# Patient Record
Sex: Male | Born: 1960 | Race: White | Hispanic: No | Smoking: Never smoker
Health system: Southern US, Community
[De-identification: ages and names within clinical notes are randomized; demographics above are authoritative.]

## PROBLEM LIST (undated history)

## (undated) ENCOUNTER — Ambulatory Visit (HOSPITAL_COMMUNITY): Admission: EM | Payer: BC Managed Care – PPO | Source: Home / Self Care

## (undated) DIAGNOSIS — F32A Depression, unspecified: Secondary | ICD-10-CM

## (undated) DIAGNOSIS — E119 Type 2 diabetes mellitus without complications: Secondary | ICD-10-CM

## (undated) DIAGNOSIS — M1990 Unspecified osteoarthritis, unspecified site: Secondary | ICD-10-CM

## (undated) DIAGNOSIS — T7840XA Allergy, unspecified, initial encounter: Secondary | ICD-10-CM

## (undated) DIAGNOSIS — I82409 Acute embolism and thrombosis of unspecified deep veins of unspecified lower extremity: Secondary | ICD-10-CM

## (undated) DIAGNOSIS — D689 Coagulation defect, unspecified: Secondary | ICD-10-CM

## (undated) DIAGNOSIS — F329 Major depressive disorder, single episode, unspecified: Secondary | ICD-10-CM

## (undated) DIAGNOSIS — D649 Anemia, unspecified: Secondary | ICD-10-CM

## (undated) HISTORY — DX: Coagulation defect, unspecified: D68.9

## (undated) HISTORY — DX: Allergy, unspecified, initial encounter: T78.40XA

## (undated) HISTORY — PX: BARIATRIC SURGERY: SHX1103

## (undated) HISTORY — DX: Anemia, unspecified: D64.9

## (undated) HISTORY — DX: Depression, unspecified: F32.A

## (undated) HISTORY — DX: Type 2 diabetes mellitus without complications: E11.9

## (undated) HISTORY — DX: Major depressive disorder, single episode, unspecified: F32.9

## (undated) HISTORY — DX: Unspecified osteoarthritis, unspecified site: M19.90

---

## 2000-06-14 ENCOUNTER — Encounter: Payer: Self-pay | Admitting: Sports Medicine

## 2000-06-14 ENCOUNTER — Ambulatory Visit (HOSPITAL_COMMUNITY): Admission: RE | Admit: 2000-06-14 | Discharge: 2000-06-14 | Payer: Self-pay | Admitting: Internal Medicine

## 2000-06-14 ENCOUNTER — Inpatient Hospital Stay (HOSPITAL_COMMUNITY): Admission: AD | Admit: 2000-06-14 | Discharge: 2000-06-16 | Payer: Self-pay | Admitting: Sports Medicine

## 2000-06-21 ENCOUNTER — Encounter: Admission: RE | Admit: 2000-06-21 | Discharge: 2000-06-21 | Payer: Self-pay | Admitting: Family Medicine

## 2000-06-24 ENCOUNTER — Encounter: Admission: RE | Admit: 2000-06-24 | Discharge: 2000-06-24 | Payer: Self-pay | Admitting: Family Medicine

## 2000-06-28 ENCOUNTER — Encounter: Admission: RE | Admit: 2000-06-28 | Discharge: 2000-06-28 | Payer: Self-pay | Admitting: Family Medicine

## 2000-07-02 ENCOUNTER — Encounter: Admission: RE | Admit: 2000-07-02 | Discharge: 2000-07-02 | Payer: Self-pay | Admitting: Family Medicine

## 2000-07-08 ENCOUNTER — Encounter: Admission: RE | Admit: 2000-07-08 | Discharge: 2000-07-08 | Payer: Self-pay | Admitting: Family Medicine

## 2000-08-27 ENCOUNTER — Encounter: Admission: RE | Admit: 2000-08-27 | Discharge: 2000-08-27 | Payer: Self-pay | Admitting: Family Medicine

## 2002-02-10 ENCOUNTER — Encounter: Admission: RE | Admit: 2002-02-10 | Discharge: 2002-02-10 | Payer: Self-pay | Admitting: Family Medicine

## 2003-10-22 ENCOUNTER — Emergency Department (HOSPITAL_COMMUNITY): Admission: EM | Admit: 2003-10-22 | Discharge: 2003-10-23 | Payer: Self-pay | Admitting: Emergency Medicine

## 2003-10-22 ENCOUNTER — Encounter: Admission: RE | Admit: 2003-10-22 | Discharge: 2003-10-22 | Payer: Self-pay | Admitting: Sports Medicine

## 2003-10-22 ENCOUNTER — Ambulatory Visit (HOSPITAL_COMMUNITY): Admission: RE | Admit: 2003-10-22 | Discharge: 2003-10-22 | Payer: Self-pay | Admitting: Emergency Medicine

## 2003-10-29 ENCOUNTER — Encounter: Admission: RE | Admit: 2003-10-29 | Discharge: 2003-10-29 | Payer: Self-pay | Admitting: Family Medicine

## 2004-06-17 ENCOUNTER — Encounter: Admission: RE | Admit: 2004-06-17 | Discharge: 2004-06-17 | Payer: Self-pay | Admitting: Sports Medicine

## 2008-12-14 HISTORY — PX: VENA CAVA FILTER PLACEMENT: SUR1032

## 2009-07-25 ENCOUNTER — Ambulatory Visit: Admission: RE | Admit: 2009-07-25 | Discharge: 2009-07-25 | Payer: Self-pay | Admitting: Internal Medicine

## 2009-07-25 ENCOUNTER — Encounter: Payer: Self-pay | Admitting: Internal Medicine

## 2009-07-29 ENCOUNTER — Inpatient Hospital Stay (HOSPITAL_COMMUNITY): Admission: EM | Admit: 2009-07-29 | Discharge: 2009-08-07 | Payer: Self-pay | Admitting: Emergency Medicine

## 2009-07-30 ENCOUNTER — Encounter (INDEPENDENT_AMBULATORY_CARE_PROVIDER_SITE_OTHER): Payer: Self-pay | Admitting: Internal Medicine

## 2009-08-01 ENCOUNTER — Ambulatory Visit: Payer: Self-pay | Admitting: Vascular Surgery

## 2009-08-01 ENCOUNTER — Encounter (INDEPENDENT_AMBULATORY_CARE_PROVIDER_SITE_OTHER): Payer: Self-pay | Admitting: Internal Medicine

## 2009-08-02 ENCOUNTER — Ambulatory Visit: Payer: Self-pay | Admitting: Vascular Surgery

## 2009-08-12 ENCOUNTER — Encounter: Admission: RE | Admit: 2009-08-12 | Discharge: 2009-08-12 | Payer: Self-pay | Admitting: Family Medicine

## 2011-03-21 LAB — GLUCOSE, CAPILLARY
Glucose-Capillary: 116 mg/dL — ABNORMAL HIGH (ref 70–99)
Glucose-Capillary: 122 mg/dL — ABNORMAL HIGH (ref 70–99)
Glucose-Capillary: 132 mg/dL — ABNORMAL HIGH (ref 70–99)
Glucose-Capillary: 138 mg/dL — ABNORMAL HIGH (ref 70–99)
Glucose-Capillary: 154 mg/dL — ABNORMAL HIGH (ref 70–99)
Glucose-Capillary: 158 mg/dL — ABNORMAL HIGH (ref 70–99)
Glucose-Capillary: 170 mg/dL — ABNORMAL HIGH (ref 70–99)
Glucose-Capillary: 183 mg/dL — ABNORMAL HIGH (ref 70–99)
Glucose-Capillary: 201 mg/dL — ABNORMAL HIGH (ref 70–99)
Glucose-Capillary: 21 mg/dL — CL (ref 70–99)
Glucose-Capillary: 210 mg/dL — ABNORMAL HIGH (ref 70–99)
Glucose-Capillary: 217 mg/dL — ABNORMAL HIGH (ref 70–99)
Glucose-Capillary: 258 mg/dL — ABNORMAL HIGH (ref 70–99)
Glucose-Capillary: 272 mg/dL — ABNORMAL HIGH (ref 70–99)
Glucose-Capillary: 273 mg/dL — ABNORMAL HIGH (ref 70–99)
Glucose-Capillary: 99 mg/dL (ref 70–99)

## 2011-03-21 LAB — BASIC METABOLIC PANEL
BUN: 11 mg/dL (ref 6–23)
BUN: 12 mg/dL (ref 6–23)
BUN: 14 mg/dL (ref 6–23)
BUN: 9 mg/dL (ref 6–23)
CO2: 26 mEq/L (ref 19–32)
CO2: 26 mEq/L (ref 19–32)
CO2: 27 mEq/L (ref 19–32)
CO2: 27 mEq/L (ref 19–32)
CO2: 28 mEq/L (ref 19–32)
Calcium: 10.1 mg/dL (ref 8.4–10.5)
Calcium: 9 mg/dL (ref 8.4–10.5)
Calcium: 9.1 mg/dL (ref 8.4–10.5)
Calcium: 9.5 mg/dL (ref 8.4–10.5)
Calcium: 9.8 mg/dL (ref 8.4–10.5)
Chloride: 100 mEq/L (ref 96–112)
Chloride: 101 mEq/L (ref 96–112)
Chloride: 102 mEq/L (ref 96–112)
Chloride: 98 mEq/L (ref 96–112)
Creatinine, Ser: 0.76 mg/dL (ref 0.4–1.5)
Creatinine, Ser: 0.8 mg/dL (ref 0.4–1.5)
Creatinine, Ser: 0.86 mg/dL (ref 0.4–1.5)
Creatinine, Ser: 0.87 mg/dL (ref 0.4–1.5)
Creatinine, Ser: 0.91 mg/dL (ref 0.4–1.5)
GFR calc Af Amer: >60 mL/min (ref >60)
GFR calc Af Amer: >60 mL/min (ref >60)
GFR calc Af Amer: >60 mL/min (ref >60)
GFR calc Af Amer: >60 mL/min (ref >60)
GFR calc non Af Amer: >60 mL/min (ref >60)
GFR calc non Af Amer: >60 mL/min (ref >60)
GFR calc non Af Amer: >60 mL/min (ref >60)
Glucose, Bld: 212 mg/dL — ABNORMAL HIGH (ref 70–99)
Potassium: 4.2 mEq/L (ref 3.5–5.1)
Potassium: 4.3 mEq/L (ref 3.5–5.1)
Potassium: 4.8 mEq/L (ref 3.5–5.1)
Sodium: 131 mEq/L — ABNORMAL LOW (ref 135–145)
Sodium: 135 mEq/L (ref 135–145)
Sodium: 137 mEq/L (ref 135–145)

## 2011-03-21 LAB — CBC
HCT: 44 % (ref 39.0–52.0)
HCT: 44.4 % (ref 39.0–52.0)
HCT: 49.8 % (ref 39.0–52.0)
Hemoglobin: 14.8 g/dL (ref 13.0–17.0)
Hemoglobin: 15.3 g/dL (ref 13.0–17.0)
Hemoglobin: 16 g/dL (ref 13.0–17.0)
Hemoglobin: 17.1 g/dL — ABNORMAL HIGH (ref 13.0–17.0)
MCHC: 34.1 g/dL (ref 30.0–36.0)
MCHC: 34.3 g/dL (ref 30.0–36.0)
MCHC: 34.4 g/dL (ref 30.0–36.0)
MCHC: 34.4 g/dL (ref 30.0–36.0)
MCHC: 34.8 g/dL (ref 30.0–36.0)
MCV: 88.9 fL (ref 78.0–100.0)
MCV: 89.1 fL (ref 78.0–100.0)
MCV: 89.1 fL (ref 78.0–100.0)
MCV: 89.2 fL (ref 78.0–100.0)
MCV: 89.3 fL (ref 78.0–100.0)
MCV: 89.4 fL (ref 78.0–100.0)
MCV: 89.4 fL (ref 78.0–100.0)
Platelets: 241 10*3/uL (ref 150–400)
Platelets: 251 10*3/uL (ref 150–400)
Platelets: 252 10*3/uL (ref 150–400)
RBC: 4.79 MIL/uL (ref 4.22–5.81)
RBC: 4.83 MIL/uL (ref 4.22–5.81)
RBC: 4.89 MIL/uL (ref 4.22–5.81)
RBC: 5.22 MIL/uL (ref 4.22–5.81)
RBC: 5.6 MIL/uL (ref 4.22–5.81)
RDW: 12.2 % (ref 11.5–15.5)
RDW: 12.2 % (ref 11.5–15.5)
RDW: 12.3 % (ref 11.5–15.5)
RDW: 12.4 % (ref 11.5–15.5)
WBC: 10.2 10*3/uL (ref 4.0–10.5)
WBC: 7.8 10*3/uL (ref 4.0–10.5)
WBC: 8.8 10*3/uL (ref 4.0–10.5)
WBC: 9.6 10*3/uL (ref 4.0–10.5)
WBC: 9.7 10*3/uL (ref 4.0–10.5)
WBC: 9.9 10*3/uL (ref 4.0–10.5)

## 2011-03-21 LAB — HEPARIN LEVEL (UNFRACTIONATED)
Heparin Unfractionated: 0.3 IU/mL (ref 0.30–0.70)
Heparin Unfractionated: 0.33 IU/mL (ref 0.30–0.70)
Heparin Unfractionated: 0.43 IU/mL (ref 0.30–0.70)

## 2011-03-21 LAB — LUPUS ANTICOAGULANT PANEL: PTT Lupus Anticoagulant: 37.5 secs (ref 36.3–48.8)

## 2011-03-21 LAB — DIFFERENTIAL
Basophils Absolute: 0.1 10*3/uL (ref 0.0–0.1)
Basophils Relative: 0 % (ref 0–1)
Basophils Relative: 1 % (ref 0–1)
Eosinophils Absolute: 0.4 10*3/uL (ref 0.0–0.7)
Eosinophils Absolute: 0.6 10*3/uL (ref 0.0–0.7)
Eosinophils Relative: 5 % (ref 0–5)
Lymphs Abs: 1.9 10*3/uL (ref 0.7–4.0)
Monocytes Absolute: 0.6 10*3/uL (ref 0.1–1.0)
Monocytes Absolute: 0.8 10*3/uL (ref 0.1–1.0)
Monocytes Relative: 8 % (ref 3–12)
Neutro Abs: 6.4 10*3/uL (ref 1.7–7.7)
Neutrophils Relative %: 57 % (ref 43–77)

## 2011-03-21 LAB — LIPID PANEL
HDL: 32 mg/dL — ABNORMAL LOW (ref >39)
HDL: 33 mg/dL — ABNORMAL LOW (ref >39)
Triglycerides: 214 mg/dL — ABNORMAL HIGH (ref <150)
VLDL: 43 mg/dL — ABNORMAL HIGH (ref 0–40)

## 2011-03-21 LAB — FACTOR 5 LEIDEN

## 2011-03-21 LAB — CULTURE, BLOOD (ROUTINE X 2)
Culture: NO GROWTH
Culture: NO GROWTH

## 2011-03-21 LAB — PROTIME-INR
INR: 1 (ref 0.00–1.49)
INR: 1.1 (ref 0.00–1.49)
INR: 1.1 (ref 0.00–1.49)
INR: 1.2 (ref 0.00–1.49)
INR: 1.9 — ABNORMAL HIGH (ref 0.00–1.49)
INR: 2.7 — ABNORMAL HIGH (ref 0.00–1.49)
Prothrombin Time: 15.5 seconds — ABNORMAL HIGH (ref 11.6–15.2)
Prothrombin Time: 17.8 seconds — ABNORMAL HIGH (ref 11.6–15.2)
Prothrombin Time: 20.7 seconds — ABNORMAL HIGH (ref 11.6–15.2)

## 2011-03-21 LAB — PHOSPHORUS
Phosphorus: 3.2 mg/dL (ref 2.3–4.6)
Phosphorus: 3.4 mg/dL (ref 2.3–4.6)
Phosphorus: 4.4 mg/dL (ref 2.3–4.6)

## 2011-03-21 LAB — MAGNESIUM
Magnesium: 2 mg/dL (ref 1.5–2.5)
Magnesium: 2.2 mg/dL (ref 1.5–2.5)
Magnesium: 2.4 mg/dL (ref 1.5–2.5)
Magnesium: 2.4 mg/dL (ref 1.5–2.5)

## 2011-03-21 LAB — TSH: TSH: 3.426 u[IU]/mL (ref 0.350–4.500)

## 2011-03-21 LAB — HEMOGLOBIN A1C: Mean Plasma Glucose: 260 mg/dL

## 2011-03-21 LAB — BETA-2-GLYCOPROTEIN I ABS, IGG/M/A: Beta-2-Glycoprotein I IgM: <10 U/mL (ref <20)

## 2011-03-21 LAB — PROTEIN S ACTIVITY: Protein S Activity: 89 % (ref 69–129)

## 2011-03-21 LAB — PROTEIN S, TOTAL: Protein S Ag, Total: 172 % — ABNORMAL HIGH (ref 70–140)

## 2011-03-21 LAB — CARDIOLIPIN ANTIBODIES, IGG, IGM, IGA: Anticardiolipin IgA: <10 [APL'U] — ABNORMAL LOW (ref <12)

## 2011-03-21 LAB — PROTEIN C ACTIVITY: Protein C Activity: 195 % — ABNORMAL HIGH (ref 75–133)

## 2011-03-21 LAB — PROTHROMBIN GENE MUTATION

## 2011-04-02 ENCOUNTER — Emergency Department (HOSPITAL_COMMUNITY)
Admission: EM | Admit: 2011-04-02 | Discharge: 2011-04-02 | Disposition: A | Discharge status: Home / Self Care | Payer: BC Managed Care – PPO | Attending: Emergency Medicine | Admitting: Emergency Medicine

## 2011-04-02 DIAGNOSIS — M7120 Synovial cyst of popliteal space [Baker], unspecified knee: Secondary | ICD-10-CM | POA: Insufficient documentation

## 2011-04-02 DIAGNOSIS — I10 Essential (primary) hypertension: Secondary | ICD-10-CM | POA: Insufficient documentation

## 2011-04-02 DIAGNOSIS — M79609 Pain in unspecified limb: Secondary | ICD-10-CM | POA: Insufficient documentation

## 2011-04-02 DIAGNOSIS — E669 Obesity, unspecified: Secondary | ICD-10-CM | POA: Insufficient documentation

## 2011-04-02 DIAGNOSIS — E119 Type 2 diabetes mellitus without complications: Secondary | ICD-10-CM | POA: Insufficient documentation

## 2011-04-02 DIAGNOSIS — M7989 Other specified soft tissue disorders: Secondary | ICD-10-CM | POA: Insufficient documentation

## 2011-04-28 NOTE — H&P (Signed)
NAME:  David Weeks, David Weeks                 ACCOUNT NO.:  [id number]   MEDICAL RECORD NO.:  [id number]          PATIENT TYPE:  INP   LOCATION:  4731                         FACILITY:  [facility]   PHYSICIAN:  Thad Ranger, MD       DATE OF BIRTH:  1961-10-21   DATE OF ADMISSION:  07/29/2009  DATE OF DISCHARGE:                              HISTORY & PHYSICAL   PRIMARY CARE PHYSICIAN:  Marjory Lies, MD   CHIEF COMPLAINT:  Acute shortness of breath with a history of DVT.   HISTORY OF PRESENT ILLNESS:  David Weeks is a 50 year old male with  history of left lower extremity 9 years ago, hypertension,  hyperlipidemia, and diabetes mellitus presented [facility]  today with acute dyspnea on exertion, dizziness, lightheadedness with  feeling clammy, and diaphoretic.  The patient was just recently  diagnosed with acute DVT in right lower extremity on July 25, 2009,  when he felt pain and swelling in his right leg.  CT angiogram was done  today, which showed large acute pulmonary embolism in the distal main  pulmonary artery extending into the right lower lobe and right middle  lobe of the lung.  Incidentally, the patient has been on therapeutic  Lovenox and Coumadin 5 mg daily.  However, INR is still subtherapeutic  at 1.0.  Lovenox and Coumadin was started on July 25, 2009, when the  patient was diagnosed with acute DVT.  The patient has a history of DVT  in the left lower extremity 9 years ago, then he was treated with  Lovenox and Coumadin for 6 months.  The patient also has family history  of DVTs and clotting disorder in his mother.  The patient, otherwise,  denies any trauma to the leg or any prolonged travels recently.  He,  otherwise, denies any chest pain, nausea, vomiting, abdominal pain,  diarrhea, or constipation.   REVIEW OF SYSTEMS:  Ten-point review of system are negative except as,  otherwise, dictated in his HPI.   PAST MEDICAL HISTORY:  1. Hypertension.  2.  Hyperlipidemia.  3. History of DVT in the left lower extremity 9 years ago.  4. Diabetes mellitus.   PAST SURGICAL HISTORY:  None.   SOCIAL HISTORY:  The patient denies any smoking, drinks alcohol  occasionally, and denies any drug use.  He currently lives at home with  his family and is functional with his ADLs.   ALLERGIES:  No known drug allergies.   MEDICATIONS PRIOR TO ADMISSION:  1. Glipizide 5 mg b.i.d.  2. Metformin 1000 mg b.i.d.  3. Multivitamin 1 tablet p.o. daily.  4. Warfarin 5 mg daily.  5. Lovenox 1 mg/kg subcu twice daily.  6. Fish oil daily.  7. Crestor 20 mg daily.  8. Hydrocodone/APAP 5/500 q.6 h. p.r.n. pain.  9. Lisinopril 20 mg p.o. daily.   PHYSICAL EXAMINATION:  GENERAL:  The patient is alert, awake, and  oriented x3; not in any acute distress.  HEENT:  Anicteric sclerae, pink conjunctivae.  Pupils are reactive to  light and accommodation.  EOMI.  NECK:  Supple.  No lymphadenopathy.  No JVD.  No thyromegaly.  CVS:  S1 and S2 clear.  Regular rate and rhythm.  No murmur, rubs, or  gallops.  CHEST:  Clear to auscultation bilaterally.  No wheezing, rales, or  rhonchi.  ABDOMEN:  Obese, nontender, and nondistended.  Normal bowel sounds.  EXTREMITIES:  Right lower extremity mildly swollen.  No tenderness.  NEURO:  No focal neurological deficits noted.  Cranial nerves II through  XII intact.  SKIN:  No rashes or any decubitus noted.  GU:  No Foley noted.  No CV angle tenderness noted.  PSYCHIATRIC:  Normal.   LABORATORY DIAGNOSTIC DATA:  WBC 7.8, hemoglobin 17.1, hematocrit 49.8,  MCV 88.9, and platelets 227.  INR 1.0, sodium 135, potassium 4.2,  chloride 98, BUN and creatinine 9 and 0.8, glucose 212.  BNP less than  30.   CT angiogram of the chest was done today, which showed:  1. Large pulmonary embolism involving the distal right main pulmonary      artery extending primarily into the right lower lobe and right      middle lobe, being partially  occlusive.  2. Fatty infiltration of the liver.  3. Cardiomegaly.   Chest x-ray was done, which showed no acute findings.  The patient had a  Doppler ultrasound of the right lower extremity on July 25, 2009,  which showed indeterminate age DVT involving the right lower  extremities.  No evidence of Baker cyst on the right.   EKG showed sinus tachycardia at the rate of 101 with left posterior  fascicular block.   IMPRESSION AND PLAN:  David Weeks is a 50 year old male with known history  of deep venous thrombosis, hypertension, hyperlipidemia, and genetic  clotting disorder in his family with diabetes mellitus, presents with  acute deep venous thrombosis and acute pulmonary embolism today.  The  patient has been on therapeutic dose of Lovenox with Coumadin; however,  INR is still subtherapeutic.  1. Acute pulmonary embolism with acute deep venous thrombosis.  The      patient will be admitted to medical floor with tele monitor.  He      will be started on therapeutic dose of Lovenox with Coumadin 10 mg      and per pharmacy protocol until INR is above 2.  I will also obtain      echocardiogram to assess the right ventricular function, as the      patient has considerably large pulmonary embolism involving the      right pulmonary artery and extending into the right middle and      lower lobe of the lungs.  I will obtain a daily INR monitoring for      the Coumadin dosing.  Most likely, the patient will now require      lifelong anticoagulation due to genetic hypercoagulability and      recurrent episodes.  2. Diabetes mellitus.  The patient will be placed on sliding scale      insulin while inpatient for better glycemia control.  Blood glucose      on admission is 212.  I will also obtain HbA1c to assess the      overall glycemia control.  3. Hypertension.  The patient will be restarted on lisinopril.  4. Hyperlipidemia.  The patient will be restarted on Crestor on an       outpatient dose.  5. Prophylaxis.  He will be on Coumadin and full dose Lovenox.  6. Code status, full  code.  This patient will be followed by Team A      Hospitalist team.      Thad Ranger, MD  Electronically Signed     RR/MEDQ  D:  07/29/2009  T:  07/30/2009  Job:  [id number]

## 2011-04-28 NOTE — Op Note (Signed)
NAME:  David Weeks, MINICHIELLO                 ACCOUNT NO.:  [id number]   MEDICAL RECORD NO.:  [id number]          PATIENT TYPE:  INP   LOCATION:  4743                         FACILITY:  [facility]   PHYSICIAN:  [name], MD  DATE OF BIRTH:  [birth year]   DATE OF PROCEDURE:  08/02/2009  DATE OF DISCHARGE:                               OPERATIVE REPORT   SURGEON:  [name], MD   PROCEDURES:  1. Ultrasound of left groin.  2. Inferior vena cava gram.  3. Placement of trapeze inferior vena cava filter.   PREOPERATIVE DIAGNOSIS:  Right femoral deep venous thrombosis with  pulmonary embolus.   POSTOPERATIVE DIAGNOSIS:  Right femoral deep venous thrombosis with  pulmonary embolus.   ANESTHESIA:  Local with IV sedation.   OPERATIVE DETAILS:  After obtaining informed consent, the patient was  brought to the [facility].  The patient was placed in supine position on the  angio table.  Both groins were prepped and draped in the usual sterile  fashion.  Ultrasound was used to identify the left common femoral vein.  This had normal compressibility and respiratory variation suggesting no  evidence of DVT in the left femoral vein.  At this point, local  anesthesia was infiltrated over the area of the left femoral vein.  Introducer needle was used to cannulate the left common femoral vein and  the femoral artery was inadvertently stuck on the first puncture and  direct pressure was held for approximately 3 minutes.  After this, a  micropuncture set was used to cannulate the left common femoral vein  without difficulty.  Guidewire was advanced into the left iliac vein  system.  The micropuncture sheath was then placed over this.  The  dilator and guidewire were removed.  There was good venous backbleeding  at this point.  A 0.035 J-tip guidewire was then threaded into the  inferior vena cava under fluoroscopic guidance.  The micropuncture  sheath was removed and exchanged for the 7-French delivery  sheath of the  trapeze filter.  The sheath and dilator were advanced as a unit up to  the level of the inferior vena cava.  The inferior vena cava gram was  obtained.  On the initial run, the right renal vein was thought to be  visualized but not well visualized.  Of note, the vena cava was less  than 30 mm in diameter.  An additional vena cava gram was performed to  confirm level of the left and right renal veins.  The IVC filter was  then loaded into the device and advanced up to the level of the L2-L3  interspace and deployed using a pull-push technique.  Filter deployed  without difficulty.  The sheath was then removed and hemostasis was  obtained with direct pressure.  The patient tolerated the procedure  well, and there were no complications.  The patient was taken to the  holding area in stable condition.   OPERATIVE FINDINGS:  1. Patent inferior vena cava less than 30 mm in diameter.  2. No left femoral DVT.  3. Successful  deployment of trapeze filter inferior vena cava at L2-L3      interspace level.      [name], MD  Electronically Signed     CEF/MEDQ  D:  08/02/2009  T:  08/03/2009  Job:  [id number]

## 2011-04-28 NOTE — Discharge Summary (Signed)
David Weeks, David Weeks                 ACCOUNT NO.:  [id number]   MEDICAL RECORD NO.:  [id number]          PATIENT TYPE:  INP   LOCATION:  4743                         FACILITY:  [facility]   PHYSICIAN:  Richarda Overlie, MD       DATE OF BIRTH:  [birth year]   DATE OF ADMISSION:  07/29/2009  DATE OF DISCHARGE:  08/03/2009                               DISCHARGE SUMMARY   PRIMARY CARE PHYSICIAN:  Dr. Marjory Lies.   DISCHARGE DIAGNOSES:  1. Acute deep venous thrombosis.  2. Acute pulmonary embolus.  3. Uncontrolled diabetes.  4. Hypertension.  5. Dyslipidemia.   PROCEDURE:  1. IVC filter placement on August 02, 2009, by Dr. Fabienne Bruns with      placement of a Trapeze inferior vena cava filter  2. Chest x-ray on July 29, 2009, that shows no acute findings.  CT      angio on July 29, 2009, that shows a the large pulmonary embolism      involving the distal right main pulmonary artery extending      primarily into the right lower lobe and right middle lobe.  3. Fatty infiltration of the liver and cardiomegaly.  4. Ultrasound of the scrotum that is unremarkable for any hernia or      testicular torsion.   PERTINENT LABS:  Hemoglobin A1c of 10.7.  Hypercoagulable panel on  July 30, 2009, that showed mildly elevated levels of antithrombin III,  protein C and protein S.  It is also negative for factor V mutation,  negative for prothrombin to gene mutation.   SUBJECTIVE:  This is a 50 year old male with a history of left lower  extremity DVT 9 years ago, hypertension, dyslipidemia and diabetes, who  presents from [facility] for acute onset of dyspnea with  exertion and dizziness, lightheadedness and feeling clammy and  diaphoretic.  The patient was diagnosed with an acute DVT on July 25, 2009.  A CT angiogram in the ER revealed a large acute pulmonary  embolism in the distal main pulmonary artery extending into the right  lower lobe and right middle lobe of the lung.  The  patient was also  initiated on Coumadin and assessed, but was found to be subtherapeutic  with an INR of 1.0 on admission.  The patient was also initiated on  Lovenox, but found to be allergic to Lovenox.  The patient was admitted  for optimization of his anticoagulation status, as well as treatment of  his acute pulmonary embolism and hemodynamic monitoring.   HOSPITAL COURSE:  1. Acute PE.  The patient was placed on a heparin drip.  Coumadin was      titrated per pharmacy protocol.  An echocardiogram was done to      evaluate his right ventricular function.  Echocardiogram showed an      ejection fraction of 55-60% with a grade 1 diastolic dysfunction      without any right heart  strain, however, the windows were poor and      the right ventricle was poorly visualized.  The patient also had  a      hypercoagulable panel that was essentially negative.  The patient      has been extremely resistant to Coumadin and the doses have had to      be titrated on a daily basis.  The patient did achieve a      therapeutic INR on August 03, 2009, but dropped back again to 1.9      and then 1.8.  He is up to 25 mg of Coumadin p.o. every day.      Because of the patient's increased risk of recurrent DVT, the      patient will need at least to consecutive INRs of greater than 2      prior to his discharge which has yet not been achieved.  Therefore,      the patient's discharged will not be done until the patient has two      consecutive INRs greater than 2.  The patient had  consultation by      Dr. Fabienne Bruns and had an IVC filter placed after an ultrasound      confirmed a proximal DVT in his right groin.   1. Diabetes.  The patient's diabetes is poorly controlled with an A1c      of 10.7.  He was initiated on Lantus and sliding scale insulin.      His metformin was held for contrast studies, which is being resumed      now.  The patient's glipizide has been increased to 10 mg p.o.       twice a day and he has been started on Januvia.  2. Intermittent right groin pain.  The patient reportedly had      intermittent right groin pain every time he bent over.  He also      reported pain radiating from his lumbar spine and was suspected to      be secondary to lumbar radiculopathy as an ultrasound of the right      groin did not confirm any hernia and there was no evidence of any      hernia on physical exam.  The patient was encouraged to ambulate      and pain control was optimized.  3. Hypertension.  The patient was continued on lisinopril for his      hypertension.   DISCHARGE MEDICATIONS:  Will be dictated in detail at the time of  discharge by the discharge attending.  Please note that the patient's  final dose of Coumadin has still not been determined and the pharmacist  has been requested to verify this today.      Richarda Overlie, MD  Electronically Signed     NA/MEDQ  D:  08/06/2009  T:  08/06/2009  Job:  [id number]

## 2011-04-28 NOTE — Discharge Summary (Signed)
NAMEBOMANI, OOMMEN NO.:  [id number]   MEDICAL RECORD NO.:  [id number]          PATIENT TYPE:  INP   LOCATION:  4743                         FACILITY:  [facility]   PHYSICIAN:  Isidor Holts, M.D.  DATE OF BIRTH:  Mar 14, 1961   DATE OF ADMISSION:  07/29/2009  DATE OF DISCHARGE:  08/07/2009                               DISCHARGE SUMMARY   PRIMARY CARE PHYSICIAN:  Marjory Lies, M.D.   For discharge diagnoses, refer to interim summary dictated August 06, 2009, by Dr. Richarda Overlie.  Refer also to above summary, for details of  admission history, procedures, consultations and detailed clinical  course.  On August 07, 2009, the patient was clinically stable,  asymptomatic, there were no new issues, allergic rash secondary to  Lovenox, on anterior abdominal wall had resolved, and hydrocortisone  cream was discontinued.  Because of borderline blood pressures, i.e.  99/57 on August 07, 2009, Lisinopril dose was decreased to 5 mg p.o.  daily.  CBGs appeared controlled, ranging between 116 to 157.  INR was  therapeutic on August 06, 2009, at 2.0 and 2.7 on August 07, 2009.  Heparin was therefore discontinued on August 07, 2009, and the patient  was discharged accordingly.   DISCHARGE MEDICATIONS:  1. Multivitamin one p.o. daily.  2. Crestor 20 mg p.o. daily.  3. Lisinopril 5 mg p.o. daily (was on 20 mg p.o. daily).  4. Januvia 100 mg p.o. daily.  5. Glipizide 10 mg p.o. b.i.d. (was on 5 mg p.o. b.i.d.).  6. Metformin 1000 mg p.o. b.i.d.  7. Fish oil 1 gram p.o. daily.  8. Coumadin 15 mg at 6 p.m. daily, otherwise per INR.   Note:  Hydrocodone/APAP (5/500) has been discontinued.   ACTIVITY:  Recommended to increase activity slowly.   DIET:  Carbohydrate-modified/heart-healthy.   FOLLOWUP INSTRUCTIONS:  The patient is to follow up with primary MD, Dr.  Marjory Lies, telephone number [phone], on Thursday, August 15, 2009, at 1:20 p.m.   SPECIAL  INSTRUCTIONS:  1. The patient has been instructed to have PT/INR checked in a.m. of      August 09, 2009, at Dr. Mellody Life office, telephone number [phone].  Dr. Doristine Counter will thereafter recommend changes to Coumadin      dose if indicated, and timing of the next INR check.  2. Home health RN for INR check has been arranged, i.e., [facility], telephone number [phone].  3. The patient is recommended to return to regular duties on August 21, 2009, unless indicated otherwise, by Dr Marjory Lies, his      primary MD.      Isidor Holts, M.D.  Electronically Signed     CO/MEDQ  D:  08/07/2009  T:  08/07/2009  Job:  [id number]   cc:   Marjory Lies, M.D.

## 2011-05-01 NOTE — H&P (Signed)
[place]  Patient:    David Weeks, David Weeks                          MRN: [id number] Adm. Date:  06/14/00 Attending:  Sibyl Parr. Darrick Penna, M.D. Dictator:   Carloyn Manner, M.D.                         History and Physical  DATE OF BIRTH:  October 27, 1917  PROBLEM LIST: 1. Left lower extremity deep venous thrombosis. 2. Obesity with sedentary lifestyle. 3. Depression.  HISTORY OF PRESENT ILLNESS:  This 50 year old white male with a past medical history of anxiety and depression presented approximately a week ago at an urgent care center complaining of pain and swelling of left lower extremity. The pain and swelling had been present for approximately one week at time of initial presentation.  The patient was treated at the urgent care center with indomethacin and Vicodin for what was thought to be a tendonitis in the left lower extremity.  The patients symptoms did not resolve, and, in fact, the patient had worsening of pain and difficulty with ambulation.  The patient, therefore, presented once again to the urgent care center yesterday for re-evaluation.  The patient was then scheduled for a lower extremity Doppler at [facility] at 11 a.m. this morning.  The Doppler was found to be positive for a DVT.  The patients risk factors include sedentary lifestyle.  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 The patient also has family medical history with a mother who suffered from a DVT.  The patient is unsure what her age was and the cause of her DVT.  PAST MEDICAL HISTORY:   Includes anxiety and depression.  FAMILY MEDICAL HISTORY:  Father had CVA at 30 years old and diabetes mellitus, type 2.  Mother had DVT, depression, and Parkinsons disease.  He has a 35 year old brother with hypercholesterolemia.  The patient denies any family history of any blood disorders.  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 The patient has also endured a recent separation from his wife and has two boys, ages 41 and 73 years old.  Tobacco history: The patient did chew tobacco approximately 20 years ago.  Alcohol: The patient states that he quit drinking in November 2000 but has a history of some alcohol abuse and drinking to self medicate.  ACTIVITY:  The patient confirms that he lives a sedentary  lifestyle with work at a computer and no regular exercise.  The patient does deny any long trips in automobiles or planes recently.  ALLERGIES:  No known drug allergies.  MEDICATIONS:  Include Effexor for depression, Concerta for ADD, Allegra p.r.n. for allergies, and recently has been treated with indomethacin for his leg pain.  REVIEW OF SYSTEMS:  General: The patient denies any recent weight loss, weight gain, difficulty with appetite.  Skin: The patient reports redness and swelling of the left lower extremity, although he states that the swelling of his left ankle was worse than what it presents to be this afternoon.  The patient also states that his left leg is also somewhat warm to touch.  The patient denies any other redness or swelling in other extremities. Neurologic: The patient denies any headache, dizziness, or ataxia. Respiratory: The patient denies any difficulty breathing or  chest pain. Cardiovascular: The patient denies any chest pain or palpitations.  GI: The patient denies any abdominal pain, any heartburn, any nausea, vomiting, or diarrhea.  PHYSICAL EXAMINATION:  VITAL SIGNS:  Temperature 98.6, blood pressure 130/70, heart rate 70, respiratory rate 20.  GENERAL:  He is an obese male in no acute distress.  SKIN:  Warm and dry.  Normal turgor.  He does have erythema of the left lower extremity distal and medial to knee, also some mild edema and redness of his dorsal left foot.  LUNGS:  Clear to auscultation.  No wheezes, rhonchi,  rales.  HEART:  Regular rate and rhythm.  No murmurs, rubs, or gallops.  ABDOMEN:  Obese, soft, positive bowel sounds, nontender, nondistended.  EXTREMITIES:  Full range of motion.  Negative Homans signs.  Positive tenderness of the left calf and thigh to palpation.  ASSESSMENT/PLAN: 1. Lower extremity deep venous thrombosis.  Will evaluate with labs of    antithrombin 3, protein S, protein C, and a factor V Leiden.  Will start    treatment with Lovenox 1 mg/kg every 12 hours and Coumadin 5 mg today which    will be followed with a daily PT.  The patient will be on bed rest for 24    hours.  We will also check PT, PTT, CBC, CMP, and a chest x-ray. 2. Family history of hypercholesterolemia.  Will check lipids in the a.m. 3. History of depression and anxiety.  Will continue the medication of    Effexor. 4. History of attention deficit disorder.   Will continue Concerta. 5. Will encourage patient to find and regularly follow up with a primary    care physician.DD:  06/14/00 TD:  06/14/00 Job: [id number]

## 2011-05-01 NOTE — Discharge Summary (Signed)
[place]  Patient:    David Weeks, David Weeks                          MRN: [id number] Adm. Date:  [id number] Disc. Date: [id number] Attending:  Garnette Scheuermann Dictator:   Carloyn Manner, M.D.                           Discharge Summary  PRIMARY DIAGNOSIS:  Left lower extremity deep vein thrombosis.  SECONDARY DIAGNOSES: 1. Obesity with sedentary life style. 2. Anxiety. 3. Depression.  HOSPITAL COURSE:  The patient was a direct admit from the emergent care center as he had presented for about one week with a complaint of left lower leg pain, swelling and difficulty with ambulation.  An ultrasound on the morning of admission was positive for two significant DVTs, one in the greater saphenous vein from the knee to the ankle, and the other in the posterior tibial vein from the knee to the ankle.  The patient was admitted to a medical floor and treated with Lovenox 1 mg/kg q.12h and Coumadin 5 mg, then followed with a daily PT and INR.  The patient was also on bed rest for the first 24 hours of his hospitalization.  PROCEDURES:  None.  CONSULTANTS:  None.  LABORATORY DATA:  Laboratory tests were sent for ______ , antithrombin III, protein S and protein C as the patient does have a family history of DVT, but no known coagulation disorders.  Also obtained was a total cholesterol which was elevated at 233.  HOSPITAL COURSE:  The patient improved on his therapy of Lovenox and Coumadin. The pain and swelling in his left leg had decreased, and the patient had no episodes of chest pain, shortness of breath or dizziness.  The patient was discharged home on 06/16/00.  DISCHARGE CONDITION:  Stable.  DISCHARGE INSTRUCTIONS:  Instructions on how to give Lovenox injections.  The patient was also instructed to maintain a low fat, low calorie (2000 calorie diet) per day, and keep leg elevated while sitting.  The patient is to follow up at the [facility] in two days.  DISCHARGE MEDICATIONS: 1. Lovenox injections twice a day. 2. Coumadin 5 mg daily. 3. Follow PT and INR with home health.  Home health is also to follow    heparin levels.  OTHER MEDICATIONS: 1. Concerta once daily. 2. Effexor. 3. Darvocet as needed for pain. DD:  06/20/00 TD:  06/20/00 Job: [id number]

## 2012-02-11 ENCOUNTER — Ambulatory Visit (INDEPENDENT_AMBULATORY_CARE_PROVIDER_SITE_OTHER): Payer: BC Managed Care – PPO | Admitting: Emergency Medicine

## 2012-02-11 VITALS — BP 126/83 | HR 71 | Temp 98.0°F | Resp 18 | Ht 72.0 in | Wt 338.0 lb

## 2012-02-11 DIAGNOSIS — S335XXA Sprain of ligaments of lumbar spine, initial encounter: Secondary | ICD-10-CM

## 2012-02-11 DIAGNOSIS — S39012A Strain of muscle, fascia and tendon of lower back, initial encounter: Secondary | ICD-10-CM

## 2012-02-11 DIAGNOSIS — Z0000 Encounter for general adult medical examination without abnormal findings: Secondary | ICD-10-CM

## 2012-02-11 DIAGNOSIS — I2699 Other pulmonary embolism without acute cor pulmonale: Secondary | ICD-10-CM

## 2012-02-11 MED ORDER — HYDROCODONE-ACETAMINOPHEN 5-325 MG PO TABS: 1.0000 | ORAL_TABLET | Freq: Four times a day (QID) | ORAL | Status: AC | PRN

## 2012-02-11 MED ORDER — METHOCARBAMOL 500 MG PO TABS: 500.0000 mg | ORAL_TABLET | Freq: Four times a day (QID) | ORAL | Status: AC

## 2012-02-11 NOTE — Progress Notes (Signed)
  Subjective:    Patient ID: PIO EATHERLY, male    DOB: 02-07-1961, 51 y.o.   MRN: [id number]  HPI patient enters for recheck of his PT/INR. 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 The main reason for his visit today as he pulled a muscle lifting his mother a couple of nights ago. He's had persistent pain in his low back. He has no radicular symptoms.    Review of Systems  HENT: Negative.   Respiratory: Negative.   Gastrointestinal: Negative.   Genitourinary: Negative.   Musculoskeletal:       There is tenderness over the lower lumbar spine. There is tenderness over the paralumbar muscles bilaterally. The deep tendon reflexes knees and ankles are 2+ and symmetrical motor strength is 5 out of 5 all muscle groups.       Objective:   Physical Exam        Assessment & Plan:

## 2012-02-11 NOTE — Patient Instructions (Signed)
Back Pain, Adult Low back pain is very common. About 1 in 5 people have back pain.The cause of low back pain is rarely dangerous. The pain often gets better over time.About half of people with a sudden onset of back pain feel better in just 2 weeks. About 8 in 10 people feel better by 6 weeks.  CAUSES Some common causes of back pain include:  Strain of the muscles or ligaments supporting the spine.   Wear and tear (degeneration) of the spinal discs.   Arthritis.   Direct injury to the back.  DIAGNOSIS Most of the time, the direct cause of low back pain is not known.However, back pain can be treated effectively even when the exact cause of the pain is unknown.Answering your caregiver's questions about your overall health and symptoms is one of the most accurate ways to make sure the cause of your pain is not dangerous. If your caregiver needs more information, he or she may order lab work or imaging tests (X-rays or MRIs).However, even if imaging tests show changes in your back, this usually does not require surgery. HOME CARE INSTRUCTIONS For many people, back pain returns.Since low back pain is rarely dangerous, it is often a condition that people can learn to manageon their own.   Remain active. It is stressful on the back to sit or stand in one place. Do not sit, drive, or stand in one place for more than 30 minutes at a time. Take short walks on level surfaces as soon as pain allows.Try to increase the length of time you walk each day.   Do not stay in bed.Resting more than 1 or 2 days can delay your recovery.   Do not avoid exercise or work.Your body is made to move.It is not dangerous to be active, even though your back may hurt.Your back will likely heal faster if you return to being active before your pain is gone.   Pay attention to your body when you bend and lift. Many people have less discomfortwhen lifting if they bend their knees, keep the load close to their  bodies,and avoid twisting. Often, the most comfortable positions are those that put less stress on your recovering back.   Find a comfortable position to sleep. Use a firm mattress and lie on your side with your knees slightly bent. If you lie on your back, put a pillow under your knees.   Only take over-the-counter or prescription medicines as directed by your caregiver. Over-the-counter medicines to reduce pain and inflammation are often the most helpful.Your caregiver may prescribe muscle relaxant drugs.These medicines help dull your pain so you can more quickly return to your normal activities and healthy exercise.   Put ice on the injured area.   Put ice in a plastic bag.   Place a towel between your skin and the bag.   Leave the ice on for 15 to 20 minutes, 3 to 4 times a day for the first 2 to 3 days. After that, ice and heat may be alternated to reduce pain and spasms.   Ask your caregiver about trying back exercises and gentle massage. This may be of some benefit.   Avoid feeling anxious or stressed.Stress increases muscle tension and can worsen back pain.It is important to recognize when you are anxious or stressed and learn ways to manage it.Exercise is a great option.  SEEK MEDICAL CARE IF:  You have pain that is not relieved with rest or medicine.   You have   pain that does not improve in 1 week.   You have new symptoms.   You are generally not feeling well.  SEEK IMMEDIATE MEDICAL CARE IF:   You have pain that radiates from your back into your legs.   You develop new bowel or bladder control problems.   You have unusual weakness or numbness in your arms or legs.   You develop nausea or vomiting.   You develop abdominal pain.   You feel faint.  Document Released: 11/30/2005 Document Revised: 08/12/2011 Document Reviewed: 04/20/2011 ExitCare Patient Information 2012 [employer]. 

## 2012-02-12 LAB — PROTIME-INR: INR: 2.89 — ABNORMAL HIGH (ref <1.50)

## 2013-03-29 ENCOUNTER — Other Ambulatory Visit: Payer: Self-pay | Admitting: *Deleted

## 2013-03-29 ENCOUNTER — Ambulatory Visit: Payer: BC Managed Care – PPO

## 2013-03-29 ENCOUNTER — Ambulatory Visit (INDEPENDENT_AMBULATORY_CARE_PROVIDER_SITE_OTHER): Payer: BC Managed Care – PPO | Admitting: Emergency Medicine

## 2013-03-29 VITALS — BP 140/82 | HR 84 | Temp 98.8°F | Resp 18 | Ht 72.0 in | Wt 366.0 lb

## 2013-03-29 DIAGNOSIS — J209 Acute bronchitis, unspecified: Secondary | ICD-10-CM

## 2013-03-29 DIAGNOSIS — R05 Cough: Secondary | ICD-10-CM

## 2013-03-29 DIAGNOSIS — E119 Type 2 diabetes mellitus without complications: Secondary | ICD-10-CM

## 2013-03-29 MED ORDER — HYDROCOD POLST-CHLORPHEN POLST 10-8 MG/5ML PO LQCR: 5.0000 mL | Freq: Two times a day (BID) | ORAL | Status: DC | PRN

## 2013-03-29 MED ORDER — GLUCOSE BLOOD VI STRP: ORAL_STRIP | Status: DC

## 2013-03-29 MED ORDER — ONETOUCH ULTRASOFT LANCETS MISC: Status: DC

## 2013-03-29 MED ORDER — ALBUTEROL SULFATE HFA 108 (90 BASE) MCG/ACT IN AERS: 2.0000 | INHALATION_SPRAY | RESPIRATORY_TRACT | Status: DC | PRN

## 2013-03-29 MED ORDER — PSEUDOEPHEDRINE-GUAIFENESIN ER 60-600 MG PO TB12: 1.0000 | ORAL_TABLET | Freq: Two times a day (BID) | ORAL | Status: DC

## 2013-03-29 NOTE — Addendum Note (Signed)
Addended by: Thelma Barge D on: 03/29/2013 06:37 PM   Modules accepted: Orders

## 2013-03-29 NOTE — Patient Instructions (Addendum)
Metered Dose Inhaler (No Spacer Used) Inhaled medicines are the basis of asthma treatment and other breathing problems. Inhaled medicine can only be effective if used properly. Good technique assures that the medicine reaches the lungs. Metered dose inhalers (MDIs) are used to deliver a variety of inhaled medicines. These include quick relief medicines, controller medicines (such as corticosteroids), and cromolyn. The medicine is delivered by pushing down on a metal canister to release a set amount of spray.  If you are using different kinds of inhalers, use your quick relief medicine to open the airways 10 to 15 minutes before using a steroid. If you are unsure which inhalers to use and the order of using them, ask your caregiver, nurse, or respiratory therapist. HOW TO USE THE INHALER 1. Remove cap from inhaler. 2. Shake inhaler for 5 seconds before each inhalation (breathing in). 3. Position the inhaler so that the top of the canister faces up. 4. Put your index finger on the top of the medication canister. Your thumb supports the bottom of the inhaler. 5. Open your mouth. 6. Hold the inhaler 1 to 2 inches away from your open mouth. This allows the medicine to slow down before the medicine enters the mouth. 7. Exhale (breathe out) normally and as completely as possible. 8. Press the canister down with the index finger to release the medication. 9. At the same time as the canister is pressed, inhale deeply and slowly until the lungs are completely filled. This should take 4 to 6 seconds. Keep your tongue down. 10. Hold the medication in your lungs for up to 10 seconds (10 seconds is best). This helps the medicine get into the small airways of your lungs to work better. 11. Breathe out slowly, through pursed lips. Whistling is an example of pursed lips. 12. Wait at least 1 minute between puffs. Continue with the above steps until you have taken the number of puffs your caregiver has  ordered. 13. Replace cap on inhaler. AVOID:  Inhaling before or after starting the spray of medicine. It takes practice to coordinate your breathing with triggering the spray.  Inhaling through the nose (rather than the mouth) when triggering the spray. HOW TO DETERMINE IF YOUR INHALER IS FULL OR NEARLY EMPTY:  Determine when an inhaler is empty. You cannot know when an MDI canister is empty by shaking it. A few MDIs are now being made with dose counters. Ask your caregiver for a prescription that has a dose counter if you feel you need that extra help.  If your inhaler does not have a counter, check the number of doses in the inhaler before you use it. The canister or box will list the number of doses in the canister. Divide the total number of doses in the canister by the number you will use each day to find how many days the canister will last. (For example, if your canister has 200 doses and you take 2 puffs, 4 times each day, which is 8 puffs a day. Dividing 200 by 8 equals 25. The canister should last 25 days.) Using a calendar, count forward that many days to see when your inhaler will run out. Write the refill date on a calendar or your canister.  Remember, if you need to take extra doses, the inhaler will empty sooner than you figured. Be sure you have a refill before your canister runs out. Refill your inhaler 7 to 10 days before it runs out. HOME CARE INSTRUCTIONS   Do   not use the inhaler more than your caregiver tells you. If you are still wheezing and are feeling tightness in your chest, call your caregiver.  Keep an adequate supply of medication. This includes making sure the medicine is not expired, and you have a spare MDI.  Follow your caregiver or inhaler insert directions for cleaning the inhaler. SEEK MEDICAL CARE IF:   Symptoms are only partially relieved with your inhalers.  You are having trouble using your inhalers.  You experience some increase in phlegm.  You  develop a fever of 102 F (38.9 C). SEEK IMMEDIATE MEDICAL CARE IF:   You feel little or no relief with your inhalers. You are still wheezing and are feeling shortness of breath and/or tightness in your chest.  You have side effects such as dizziness, headaches, or fast heart rate.  You have chills, fever, night sweats or an oral temperature above 102 F (38.9 C) develops.  Phlegm production increases a lot, or there is blood in the phlegm. MAKE SURE YOU:   Understand these instructions.  Will watch your condition.  Will get help right away if you are not doing well or get worse. Document Released: 09/27/2007 Document Revised: 02/22/2012 Document Reviewed: 09/17/2009 ExitCare Patient Information 2013 [employer]. Bronchitis Bronchitis is the body's way of reacting to injury and/or infection (inflammation) of the bronchi. Bronchi are the air tubes that extend from the windpipe into the lungs. If the inflammation becomes severe, it may cause shortness of breath. CAUSES  Inflammation may be caused by:  A virus.  Germs (bacteria).  Dust.  Allergens.  Pollutants and many other irritants. The cells lining the bronchial tree are covered with tiny hairs (cilia). These constantly beat upward, away from the lungs, toward the mouth. This keeps the lungs free of pollutants. When these cells become too irritated and are unable to do their job, mucus begins to develop. This causes the characteristic cough of bronchitis. The cough clears the lungs when the cilia are unable to do their job. Without either of these protective mechanisms, the mucus would settle in the lungs. Then you would develop pneumonia. Smoking is a common cause of bronchitis and can contribute to pneumonia. Stopping this habit is the single most important thing you can do to help yourself. TREATMENT   Your caregiver may prescribe an antibiotic if the cough is caused by bacteria. Also, medicines that open up your  airways make it easier to breathe. Your caregiver may also recommend or prescribe an expectorant. It will loosen the mucus to be coughed up. Only take over-the-counter or prescription medicines for pain, discomfort, or fever as directed by your caregiver.  Removing whatever causes the problem (smoking, for example) is critical to preventing the problem from getting worse.  Cough suppressants may be prescribed for relief of cough symptoms.  Inhaled medicines may be prescribed to help with symptoms now and to help prevent problems from returning.  For those with recurrent (chronic) bronchitis, there may be a need for steroid medicines. SEEK IMMEDIATE MEDICAL CARE IF:   During treatment, you develop more pus-like mucus (purulent sputum).  You have a fever.  Your baby is older than 3 months with a rectal temperature of 102 F (38.9 C) or higher.  Your baby is 3 months old or younger with a rectal temperature of 100.4 F (38 C) or higher.  You become progressively more ill.  You have increased difficulty breathing, wheezing, or shortness of breath. It is necessary to seek immediate   medical care if you are elderly or sick from any other disease. MAKE SURE YOU:   Understand these instructions.  Will watch your condition.  Will get help right away if you are not doing well or get worse. Document Released: 11/30/2005 Document Revised: 02/22/2012 Document Reviewed: 10/09/2008 ExitCare Patient Information 2013 [employer].  

## 2013-03-29 NOTE — Progress Notes (Addendum)
[facility] [address] [place] [address] [phone]  Date:  03/29/2013   Name:  David Weeks   DOB:  1961/10/03   MRN:  [id number]  PCP:  Delorse Lek, MD    Chief Complaint: Cough   History of Present Illness:  David Weeks is a 52 y.o. very pleasant male patient who presents with the following:  Ill with cough and wheezing for past two weeks.  Started with allergies. Says that he had a mucoid drainage and post nasal drip.  No fever or hills.  Cough is mostly non productive but he has a mucoid sputum.  No history of asthma but has reactive airway disease.  No improvement with OTC medications.  Says he came in at the urging of coworkers.  Says cough is worse at night.  No improvement with over the counter medications or other home remedies. Denies other complaint or health concern today.   There is no problem list on file for this patient.   No past medical history on file.  No past surgical history on file.  History  Substance Use Topics  . Smoking status: Never Smoker   . Smokeless tobacco: Not on file  . Alcohol Use: Not on file    No family history on file.  No Known Allergies  Medication list has been reviewed and updated.  Current Outpatient Prescriptions on File Prior to Visit  Medication Sig Dispense Refill  . glipiZIDE (GLUCOTROL) 10 MG tablet Take 10 mg by mouth 2 (two) times daily before a meal.      . lisinopril (PRINIVIL,ZESTRIL) 5 MG tablet Take 5 mg by mouth daily.      . metFORMIN (GLUCOPHAGE) 1000 MG tablet Take 1,000 mg by mouth 2 (two) times daily with a meal.      . rosuvastatin (CRESTOR) 20 MG tablet Take 20 mg by mouth daily.      . sitaGLIPtin (JANUVIA) 100 MG tablet Take 100 mg by mouth daily.      Marland Kitchen warfarin (COUMADIN) 10 MG tablet Take 10 mg by mouth 3 (three) times a week.      . warfarin (COUMADIN) 7.5 MG tablet Take 15 mg by mouth 4 (four) times a week.       No current facility-administered medications  on file prior to visit.    Review of Systems:  As per HPI, otherwise negative.    Physical Examination: Filed Vitals:   03/29/13 1655  BP: 140/82  Pulse: 84  Temp: 98.8 F (37.1 C)  Resp: 18   Filed Vitals:   03/29/13 1655  Height: 6' (1.829 m)  Weight: 366 lb (166.017 kg)   Body mass index is 49.63 kg/(m^2). Ideal Body Weight: Weight in (lb) to have BMI = 25: 183.9  GEN: obese, NAD, Non-toxic, A & O x 3 HEENT: Atraumatic, Normocephalic. Neck supple. No masses, No LAD. Ears and Nose: No external deformity. CV: RRR, No M/G/R. No JVD. No thrill. No extra heart sounds. PULM: CTA B, no wheezes, crackles, rhonchi. No retractions. No resp. distress. No accessory muscle use. ABD: S, NT, ND, +BS. No rebound. No HSM. EXTR: No c/c/e NEURO Normal gait.  PSYCH: Normally interactive. Conversant. Not depressed or anxious appearing.  Calm demeanor.    Assessment and Plan: Bronchitis with bronchospasm Albuterol mucinex tussionex Follow up in one week  NIDDM meter Signed,  Phillips Odor, MD   [facility] reading (PRIMARY) by  Dr. Dareen Piano.  Negative chest.

## 2013-07-29 ENCOUNTER — Ambulatory Visit (INDEPENDENT_AMBULATORY_CARE_PROVIDER_SITE_OTHER): Payer: BC Managed Care – PPO | Admitting: Internal Medicine

## 2013-07-29 VITALS — BP 128/82 | HR 100 | Temp 98.0°F | Resp 17 | Ht 75.0 in | Wt 362.0 lb

## 2013-07-29 DIAGNOSIS — Z7901 Long term (current) use of anticoagulants: Secondary | ICD-10-CM | POA: Insufficient documentation

## 2013-07-29 DIAGNOSIS — Z5181 Encounter for therapeutic drug level monitoring: Secondary | ICD-10-CM

## 2013-07-29 DIAGNOSIS — E119 Type 2 diabetes mellitus without complications: Secondary | ICD-10-CM | POA: Insufficient documentation

## 2013-07-29 DIAGNOSIS — Z9229 Personal history of other drug therapy: Secondary | ICD-10-CM | POA: Insufficient documentation

## 2013-07-29 DIAGNOSIS — M79609 Pain in unspecified limb: Secondary | ICD-10-CM

## 2013-07-29 DIAGNOSIS — E1149 Type 2 diabetes mellitus with other diabetic neurological complication: Secondary | ICD-10-CM

## 2013-07-29 DIAGNOSIS — Z86711 Personal history of pulmonary embolism: Secondary | ICD-10-CM | POA: Insufficient documentation

## 2013-07-29 LAB — PROTIME-INR: INR: 2.32 — ABNORMAL HIGH (ref <1.50)

## 2013-07-29 MED ORDER — TRAMADOL HCL 50 MG PO TABS: 50.0000 mg | ORAL_TABLET | Freq: Four times a day (QID) | ORAL | Status: DC | PRN

## 2013-07-29 NOTE — Progress Notes (Signed)
  Subjective:    Patient ID: David Weeks, male    DOB: March 11, 1961, 52 y.o.   MRN: [id number]  HPI complaining of pain in his left leg focused behind his left knee in the popliteal area. Present for 2 weeks. Affect his activity level. No definite swelling History of a Baker's cyst on that side that eventually ruptured(04-02-11 see [place]) and the pain was similar. 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 Last PT/INR was 1.0 and Coumadin was increased to 15 mg daily--Dr. Allena Katz No chest pain or shortness of breath No fever  #1 morbid obesity Problem #2 diabetes- No neuropathy until last visit when slight sensation decreased noticed on left fifth toe/does have some distal paresthesias intermittently Problem #3 multiple clots with pulmonary emboli// on Coumadin     Review of Systems  Constitutional: Negative for activity change, fatigue and unexpected weight change.  Respiratory: Negative for cough, chest tightness, shortness of breath, wheezing and stridor.   Cardiovascular: Negative for chest pain and palpitations.       Objective:   Physical Exam BP 128/82  Pulse 100  Temp(Src) 98 F (36.7 C) (Oral)  Resp 17  Ht 6\' 3"  (1.905 m)  Wt 362 lb (164.202 kg)  BMI 45.25 kg/m2  SpO2 97% No acute distress Morbid obesity HEENT clear Heart regular Left leg with mild swelling in the proximal calf and tenderness to palpation in the popliteal area without mass or defect The left posterior thigh is nontender and not swollen compared to the right No redness Distal pulses intact Distal sensory intact       Assessment & Plan:  Pain left  lower extremity-popliteal area  Chronic Coumadin therapy-needs PT/INR  We'll set Doppler of the left leg -cone Tylenol okay/physiotherapy okay

## 2013-08-01 ENCOUNTER — Ambulatory Visit
Admission: RE | Admit: 2013-08-01 | Discharge: 2013-08-01 | Disposition: A | Discharge status: Home / Self Care | Payer: BC Managed Care – PPO | Source: Ambulatory Visit | Attending: Internal Medicine | Admitting: Internal Medicine

## 2013-08-01 ENCOUNTER — Telehealth: Payer: Self-pay

## 2013-08-01 NOTE — Telephone Encounter (Signed)
Pt is looking for results of his venous ultrasound from today at [facility] imaging   Best number [phone]

## 2013-08-03 NOTE — Telephone Encounter (Signed)
I sent this to th epool 8/19pm  No clot//baker's cyst in popliteal area Can we refer to ortho for care??

## 2013-08-03 NOTE — Telephone Encounter (Signed)
He is going to follow up with PCP

## 2013-08-17 ENCOUNTER — Institutional Professional Consult (permissible substitution): Payer: BC Managed Care – PPO | Admitting: Pulmonary Disease

## 2013-08-23 ENCOUNTER — Institutional Professional Consult (permissible substitution): Payer: BC Managed Care – PPO | Admitting: Pulmonary Disease

## 2013-08-23 ENCOUNTER — Institutional Professional Consult (permissible substitution): Payer: BC Managed Care – PPO | Admitting: Internal Medicine

## 2013-08-24 ENCOUNTER — Institutional Professional Consult (permissible substitution): Payer: BC Managed Care – PPO | Admitting: Pulmonary Disease

## 2013-09-29 ENCOUNTER — Ambulatory Visit (INDEPENDENT_AMBULATORY_CARE_PROVIDER_SITE_OTHER): Payer: BC Managed Care – PPO | Admitting: Internal Medicine

## 2013-09-29 VITALS — BP 142/84 | HR 127 | Temp 98.4°F | Resp 17 | Ht 73.5 in | Wt 366.0 lb

## 2013-09-29 DIAGNOSIS — R000 Tachycardia, unspecified: Secondary | ICD-10-CM

## 2013-09-29 DIAGNOSIS — I82409 Acute embolism and thrombosis of unspecified deep veins of unspecified lower extremity: Secondary | ICD-10-CM

## 2013-09-29 DIAGNOSIS — Z5181 Encounter for therapeutic drug level monitoring: Secondary | ICD-10-CM

## 2013-09-29 DIAGNOSIS — I2699 Other pulmonary embolism without acute cor pulmonale: Secondary | ICD-10-CM

## 2013-09-29 DIAGNOSIS — I82401 Acute embolism and thrombosis of unspecified deep veins of right lower extremity: Secondary | ICD-10-CM

## 2013-09-29 LAB — POCT CBC
Hemoglobin: 15 g/dL (ref 14.1–18.1)
Lymph, poc: 2.8 (ref 0.6–3.4)
MCH, POC: 29.1 pg (ref 27–31.2)
MCHC: 31.8 g/dL (ref 31.8–35.4)
MCV: 91.5 fL (ref 80–97)
MPV: 8.6 fL (ref 0–99.8)
POC MID %: 5.7 %M (ref 0–12)
WBC: 10.5 10*3/uL — AB (ref 4.6–10.2)

## 2013-09-29 NOTE — Patient Instructions (Signed)
Warfarin   Warfarin is a blood thinner (anticoagulant) medicine. It is used to thin the blood so blood clots do not form. This medicine may cause very bad bleeding. You may also bruise easily while on this medicine. You may also bleed a little longer if you cut yourself.   Before taking warfarin, tell your doctor if:   You take any other medicine for your heart or blood pressure.   You are pregnant.   You plan to get pregnant.   You are breastfeeding.   You are allergic to any medicine.  HOME CARE   Have your blood checked as told by your doctor. Do not miss visits. Blood tests will include the PT and INR tests. These tests measure how long it takes for a clot to form in a blood sample. The test results help your doctor change your dose of warfarin if needed. If you miss your blood test visits, you could have bad bleeding or blood clots.   Take warfarin exactly as told by your doctor. If you do not, bleeding or blood clots could lead to lasting injury, pain, or disability.   Take your medicine at the same time every day. Do not miss a dose.   Tell your doctor about all medicine you take. This includes medicines, vitamins, natural products, or dietary pills (supplements). Certain medicines are not safe to take while taking warfarin. Taking other medicines while taking warfarin can affect your PT and INR test results.   Do not start or stop any medicine unless you have been told to do so by your doctor.   Do not take anything that has aspirin in it unless your doctor says it is okay.   Eat the same amount of vitamin K foods every day. Vitamin K foods can change how warfarin works and your PT and INR test results.    High vitamin K foods: Beef liver. Pork liver. Green tea. Broccoli. Brussels sprouts. Cauliflower. Chickpeas. Kale. Spinach. Turnip greens.   Medium vitamin K foods: Chicken liver. Pork tenderloin. Cheddar cheese. Rolled oats. Coffee. Asparagus. Cabbage. Iceberg lettuce.    Low vitamin K foods: Apples. Butter. Bananas. Skim or 1% milk. Canned pears. White bread. Strawberries. Corn. Tomatoes. Green beans. Eggs. Potatoes. Bacon. Pumpkin. Chicken breasts. Ground beef. Oil (except soybean oil).   Avoid making major changes to your normal diet. Talk to your doctor first before changing your normal diet.   Do not drink alcohol.   Tell your doctors and dentists that you are taking warfarin at the beginning of every visit.  GET HELP RIGHT AWAY IF:   You miss a dose of warfarin. Do not take 2 doses at the same time.   You have a skin rash.   You have heavy or unusual bleeding.   There is blood in your pee (urine) or poop (stool).   You have side effects from medicine that do not get better after a few days.  MAKE SURE YOU:   Understand these instructions.   Will watch your condition.   Will get help right away if you are not doing well or get worse.  Document Released: 01/02/2011 Document Revised: 05/31/2012 Document Reviewed: 01/02/2011  ExitCare Patient Information 2014 [employer].

## 2013-09-29 NOTE — Progress Notes (Signed)
  Subjective:    Patient ID: David Weeks, male    DOB: [birth year], 52 y.o.   MRN: [id number]  HPI 52 year old male patient here to get his INR tested. Had his INR tested back in August with a result of 2.3. Went to his family physician today, Dr. Andrey Campanile at [facility] in [place], and his INR was 0.9 while taking warfarin 20mg  on Monday and Friday and other days 15mg . He hasn't changed his diet, nor missed taking his medication. Patient is upset with his conversation at his PCP. Pulse in office is high at 127. Had prior clot in lungs. Also presents with: continual left knee pain. Diagnosed with bakers cyst through ultrasound result. 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 Pain is also in his thigh on the left leg from old DVT. He is not concerned about his pulse rate and feels normal for him. Requests repeat INR to assess. Will work with his doctor with results. No sob, cp , dizzyness.   Review of Systems obesity    Objective:   Physical Exam  Constitutional: He is oriented to person, place, and time. He appears well-nourished.  HENT:  Head: Normocephalic.  Eyes: EOM are normal.  Cardiovascular: Regular rhythm, S1 normal and S2 normal.  Tachycardia present.   Pulmonary/Chest: Effort normal and breath sounds normal.  Neurological: He is alert and oriented to person, place, and time. He exhibits normal muscle tone. Coordination normal.  Skin: He is diaphoretic.  Psychiatric: He has a normal mood and affect. His behavior is normal.    INR Results for orders placed in visit on 09/29/13  POCT CBC      Result Value Range   WBC 10.5 (*) 4.6 - 10.2 K/uL   Lymph, poc 2.8  0.6 - 3.4   POC LYMPH PERCENT 26.3  10 - 50 %L   MID (cbc) 0.6  0 - 0.9   POC MID % 5.7  0 - 12 %M   POC Granulocyte 7.1 (*) 2 - 6.9   Granulocyte percent 68.0  37 - 80 %G   RBC 5.15  4.69 - 6.13 M/uL   Hemoglobin 15.0  14.1 - 18.1 g/dL   HCT, POC 86.5  78.4 - 53.7 %   MCV 91.5  80 - 97 fL   MCH, POC 29.1  27 -  31.2 pg   MCHC 31.8  31.8 - 35.4 g/dL   RDW, POC 69.6     Platelet Count, POC 335  142 - 424 K/uL   MPV 8.6  0 - 99.8 fL        Assessment & Plan:  PE/DVT/On coumadin

## 2013-10-01 ENCOUNTER — Encounter: Payer: Self-pay | Admitting: *Deleted

## 2013-12-14 DIAGNOSIS — Z9884 Bariatric surgery status: Secondary | ICD-10-CM

## 2013-12-14 HISTORY — DX: Bariatric surgery status: Z98.84

## 2013-12-22 DIAGNOSIS — G4733 Obstructive sleep apnea (adult) (pediatric): Secondary | ICD-10-CM | POA: Insufficient documentation

## 2013-12-22 DIAGNOSIS — E785 Hyperlipidemia, unspecified: Secondary | ICD-10-CM | POA: Insufficient documentation

## 2013-12-22 DIAGNOSIS — E119 Type 2 diabetes mellitus without complications: Secondary | ICD-10-CM | POA: Insufficient documentation

## 2014-01-19 DIAGNOSIS — D6859 Other primary thrombophilia: Secondary | ICD-10-CM | POA: Insufficient documentation

## 2014-01-19 DIAGNOSIS — I82409 Acute embolism and thrombosis of unspecified deep veins of unspecified lower extremity: Secondary | ICD-10-CM | POA: Insufficient documentation

## 2014-01-19 HISTORY — DX: Acute embolism and thrombosis of unspecified deep veins of unspecified lower extremity: I82.409

## 2014-07-13 DIAGNOSIS — E785 Hyperlipidemia, unspecified: Secondary | ICD-10-CM | POA: Insufficient documentation

## 2014-07-17 DIAGNOSIS — E669 Obesity, unspecified: Secondary | ICD-10-CM | POA: Insufficient documentation

## 2014-08-10 DIAGNOSIS — E46 Unspecified protein-calorie malnutrition: Secondary | ICD-10-CM | POA: Insufficient documentation

## 2014-08-10 DIAGNOSIS — Z9884 Bariatric surgery status: Secondary | ICD-10-CM | POA: Insufficient documentation

## 2014-11-22 ENCOUNTER — Ambulatory Visit (HOSPITAL_COMMUNITY): Payer: BC Managed Care – PPO

## 2014-11-22 ENCOUNTER — Ambulatory Visit
Admission: RE | Admit: 2014-11-22 | Discharge: 2014-11-22 | Disposition: A | Discharge status: Home / Self Care | Payer: BC Managed Care – PPO | Source: Ambulatory Visit | Attending: Family Medicine | Admitting: Family Medicine

## 2014-11-22 ENCOUNTER — Other Ambulatory Visit: Payer: Self-pay | Admitting: Family Medicine

## 2014-11-22 DIAGNOSIS — M79605 Pain in left leg: Secondary | ICD-10-CM

## 2014-11-22 DIAGNOSIS — R609 Edema, unspecified: Secondary | ICD-10-CM

## 2015-02-08 ENCOUNTER — Ambulatory Visit (INDEPENDENT_AMBULATORY_CARE_PROVIDER_SITE_OTHER): Payer: BC Managed Care – PPO | Admitting: Physician Assistant

## 2015-02-08 VITALS — BP 152/83 | HR 78 | Temp 98.2°F | Resp 16 | Ht 72.0 in | Wt 276.0 lb

## 2015-02-08 DIAGNOSIS — R42 Dizziness and giddiness: Secondary | ICD-10-CM

## 2015-02-08 LAB — POCT URINALYSIS DIPSTICK
Bilirubin, UA: NEGATIVE
Glucose, UA: NEGATIVE
KETONES UA: 15
Leukocytes, UA: NEGATIVE
Nitrite, UA: NEGATIVE
PH UA: 6.5
RBC UA: NEGATIVE
SPEC GRAV UA: 1.025
UROBILINOGEN UA: 0.2

## 2015-02-08 LAB — POCT GLYCOSYLATED HEMOGLOBIN (HGB A1C): HEMOGLOBIN A1C: 5.7

## 2015-02-08 LAB — GLUCOSE, POCT (MANUAL RESULT ENTRY): POC Glucose: 111 mg/dl — AB (ref 70–99)

## 2015-02-08 NOTE — Progress Notes (Addendum)
Subjective:    Patient ID: David Weeks, male    DOB: Nov 18, 1961, 54 y.o.   MRN: [id number]  Chief Complaint  Patient presents with  . Dizziness    x 1 week   Patient Active Problem List   Diagnosis Date Noted  . Morbid obesity 07/29/2013  . Type II or unspecified type diabetes mellitus with neurological manifestations, not stated as uncontrolled(250.60) 07/29/2013  . History of pulmonary embolism 07/29/2013  . Chronic anticoagulation 07/29/2013   Prior to Admission medications   Medication Sig Start Date End Date Taking? Authorizing Provider  Calcium-Magnesium-Vitamin D (CALCIUM 500 PO) Take by mouth 3 (three) times daily.   Yes Historical Provider, MD  Cyanocobalamin (VITAMIN B12 PO) Take by mouth.   Yes Historical Provider, MD  Fish Oil OIL by Does not apply route.   Yes Historical Provider, MD  glipiZIDE (GLUCOTROL) 10 MG tablet Take 10 mg by mouth 2 (two) times daily before a meal.   Yes Historical Provider, MD  glucose blood (ONETOUCH VERIO) test strip Use as instructed twice a day. Dx code: 250.00 03/29/13  Yes Carmelina Dane, MD  lisinopril (PRINIVIL,ZESTRIL) 5 MG tablet Take 5 mg by mouth daily.   Yes Historical Provider, MD  metFORMIN (GLUCOPHAGE) 1000 MG tablet Take 500 mg by mouth 2 (two) times daily with a meal.    Yes Historical Provider, MD  rivaroxaban (XARELTO) 10 MG TABS tablet Take 10 mg by mouth daily.   Yes Historical Provider, MD  rosuvastatin (CRESTOR) 20 MG tablet Take 20 mg by mouth daily.   Yes Historical Provider, MD  Lancets [facility] ULTRASOFT) lancets Use as instructed twice a day. Dx code: 59.00 Patient not taking: Reported on 02/08/2015 03/29/13   Carmelina Dane, MD   Medications, allergies, past medical history, surgical history, family history, social history and problem list reviewed and updated.  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  Sx started approx one  wk ago. Cannot recall specific time it started. Has just felt dizzy, lightheaded majority of day past week. Denies vertigo, weakness. Denies vision changes, palps, cp, pleuritic cp, sob, cough, recent leg pain/swelling, recent uri sx. Denies fever, chills, n/v, diarrhea.   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 Was on metformin, glipizide, lantus prior. After bypass he stopped lantus, continued glipizide and cut metformin in 1/2. Has only checked bg handful of times since surgery. Once it was 70. He checked this am and was 85.   He drinks 3-4 shots liquor approx 4-5 days per week. Drinks 2 cups coffee per day. Drinks minimal water.   Has hx osa diag yrs ago. Has used cpap for yrs but has not been using much since his bypass. Does not wake with ha. Unsure if he snores, sleeps alone.   Will be seeing endo 02/22/15.   Review of Systems See HPI.     Objective:   Physical Exam  Constitutional: He is oriented to person, place, and time. He appears well-developed and well-nourished.  Non-toxic appearance. He does not have a sickly appearance. He does not appear ill. No distress.  BP 140/80 mmHg  Pulse 81  Temp(Src) 98.2 F (36.8 C) (Oral)  Resp 16  Ht 6' (1.829 m)  Wt 276 lb (125.193 kg)  BMI 37.42 kg/m2  SpO2 98%   HENT:  Right Ear: Tympanic membrane normal.  Left Ear: Tympanic  membrane normal.  Eyes: Conjunctivae and EOM are normal. Pupils are equal, round, and reactive to light.  Neck: Normal range of motion.  Cardiovascular: Normal rate, regular rhythm and normal heart sounds.  Exam reveals no gallop.   No murmur heard. Pulmonary/Chest: Effort normal and breath sounds normal. No tachypnea. He has no decreased breath sounds. He has no wheezes. He has no rhonchi. He has no rales.  Neurological: He is alert and oriented to person, place, and time. He has normal strength. No cranial nerve deficit or sensory deficit. He displays a negative Romberg sign. Coordination  and gait normal.  Negative heel to shin, neg rapid alternating movements. Neg dix halpike testing. No nystagmus.   Skin: Skin is warm and dry. No rash noted.  Psychiatric: He has a normal mood and affect. His speech is normal and behavior is normal.   Orthostatic BPs: Supine: 152/83   HR: 78 Sitting: 157/94     HR:80 Standing: 150/88    HR:94  EKG read by Dr. Merla Richesoolittle.  Findings: Normal.   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  Dizziness - Plan: POCT glycosylated hemoglobin (Hb A1C), POCT glucose (manual entry), POCT urinalysis dipstick, Basic metabolic panel, EKG 12-Lead --normal neuro exam, normal dix halpike, normal ekg --suspect from combo of dehydration (specific gravity 1.025, increased hr with orthostatics) and lower bg since gastric bypass (a1c 5.7).  --cut back/stop alcohol, caffeine, increase water intake  --stop glipizide as a1c 5.7, continue metformin --seeing endocrine in 2 wks --rtc one week if not improved --er if worsens, syncope, palps  Donnajean Lopesodd M. Onya Eutsler, PA-C Physician Assistant-Certified [facility]  02/08/2015 5:59 PM  I have participated in the care of this patient with the Advanced Practice Provider and agree with Diagnosis and Plan as documented. Robert P. Merla Richesoolittle, M.D.

## 2015-02-08 NOTE — Patient Instructions (Signed)
Your ekg looked normal today.  I think your dizziness is most likely from a combination of being dehydrated and having blood glucose trending down.  Please cut back on the alcohol, cut back on the caffeine, and increase your water intake.  Please stop taking the glipizide as your blood sugar is much better controlled now. Please continue with the metformin twice daily. Low sugars may also be contributing to your dizziness. Please go to the ED or return to care here asap if you have any passing out episodes or feel like your heart is racing.  Please follow up with your endocrinologist in 2 wks as scheduled. Please come back to see us in one week if the dizziness hasn't improved. Please return sooner if it worsens.

## 2015-02-09 LAB — BASIC METABOLIC PANEL
BUN: 13 mg/dL (ref 6–23)
CALCIUM: 10.5 mg/dL (ref 8.4–10.5)
CO2: 30 meq/L (ref 19–32)
CREATININE: 0.85 mg/dL (ref 0.50–1.35)
Chloride: 99 mEq/L (ref 96–112)
GLUCOSE: 98 mg/dL (ref 70–99)
Potassium: 4.9 mEq/L (ref 3.5–5.3)
Sodium: 139 mEq/L (ref 135–145)

## 2015-02-18 ENCOUNTER — Emergency Department (HOSPITAL_COMMUNITY)
Admission: EM | Admit: 2015-02-18 | Discharge: 2015-02-19 | Disposition: A | Discharge status: Home / Self Care | Payer: BC Managed Care – PPO | Attending: Emergency Medicine | Admitting: Emergency Medicine

## 2015-02-18 ENCOUNTER — Encounter (HOSPITAL_COMMUNITY): Payer: Self-pay | Admitting: Emergency Medicine

## 2015-02-18 DIAGNOSIS — Z86718 Personal history of other venous thrombosis and embolism: Secondary | ICD-10-CM | POA: Diagnosis not present

## 2015-02-18 DIAGNOSIS — E119 Type 2 diabetes mellitus without complications: Secondary | ICD-10-CM | POA: Diagnosis not present

## 2015-02-18 DIAGNOSIS — Z862 Personal history of diseases of the blood and blood-forming organs and certain disorders involving the immune mechanism: Secondary | ICD-10-CM | POA: Insufficient documentation

## 2015-02-18 DIAGNOSIS — R42 Dizziness and giddiness: Secondary | ICD-10-CM | POA: Diagnosis present

## 2015-02-18 DIAGNOSIS — Z7901 Long term (current) use of anticoagulants: Secondary | ICD-10-CM | POA: Diagnosis not present

## 2015-02-18 DIAGNOSIS — M1990 Unspecified osteoarthritis, unspecified site: Secondary | ICD-10-CM | POA: Diagnosis not present

## 2015-02-18 DIAGNOSIS — Z79899 Other long term (current) drug therapy: Secondary | ICD-10-CM | POA: Insufficient documentation

## 2015-02-18 HISTORY — DX: Acute embolism and thrombosis of unspecified deep veins of unspecified lower extremity: I82.409

## 2015-02-18 NOTE — ED Notes (Signed)
Pt states he has been feeling dizzy for a few weeks now  Pt states he went to urgent medical care a week or so ago for same  Pt states he had weight loss surgery 6 mths ago and since his Hemoglobin A1C has been coming down  Pt states he also takes lisinopril to protect his kidneys and he just realized he hasnt taken it for about a week   Pt states he took his blood pressure tonight at a store and the machine read high  Pt states he feels like he has been drinking but he has not

## 2015-02-19 ENCOUNTER — Emergency Department (HOSPITAL_COMMUNITY): Payer: BC Managed Care – PPO

## 2015-02-19 LAB — CBC WITH DIFFERENTIAL/PLATELET
Basophils Absolute: 0 10*3/uL (ref 0.0–0.1)
Basophils Relative: 0 % (ref 0–1)
EOS ABS: 0.1 10*3/uL (ref 0.0–0.7)
EOS PCT: 1 % (ref 0–5)
HCT: 43.7 % (ref 39.0–52.0)
HEMOGLOBIN: 14.6 g/dL (ref 13.0–17.0)
Lymphocytes Relative: 21 % (ref 12–46)
Lymphs Abs: 1.9 10*3/uL (ref 0.7–4.0)
MCH: 30.9 pg (ref 26.0–34.0)
MCHC: 33.4 g/dL (ref 30.0–36.0)
MCV: 92.4 fL (ref 78.0–100.0)
MONOS PCT: 10 % (ref 3–12)
Monocytes Absolute: 0.9 10*3/uL (ref 0.1–1.0)
Neutro Abs: 6.4 10*3/uL (ref 1.7–7.7)
Neutrophils Relative %: 68 % (ref 43–77)
Platelets: 224 10*3/uL (ref 150–400)
RBC: 4.73 MIL/uL (ref 4.22–5.81)
RDW: 13.8 % (ref 11.5–15.5)
WBC: 9.4 10*3/uL (ref 4.0–10.5)

## 2015-02-19 LAB — BASIC METABOLIC PANEL
Anion gap: 8 (ref 5–15)
BUN: 20 mg/dL (ref 6–23)
CO2: 28 mmol/L (ref 19–32)
CREATININE: 0.85 mg/dL (ref 0.50–1.35)
Calcium: 9.5 mg/dL (ref 8.4–10.5)
Chloride: 102 mmol/L (ref 96–112)
Glucose, Bld: 194 mg/dL — ABNORMAL HIGH (ref 70–99)
Potassium: 4 mmol/L (ref 3.5–5.1)
SODIUM: 138 mmol/L (ref 135–145)

## 2015-02-19 LAB — CBG MONITORING, ED: Glucose-Capillary: 196 mg/dL — ABNORMAL HIGH (ref 70–99)

## 2015-02-19 NOTE — ED Notes (Signed)
Spoke with Dr. Norlene Campbelltter regarding EKG and Orthostatics, she stated they were no longer needed, therefore task were not completed.

## 2015-02-19 NOTE — ED Notes (Signed)
Dr Otter at bedside  

## 2015-02-19 NOTE — ED Provider Notes (Signed)
CSN: [id number]     Arrival date & time 02/18/15  2252 History   First MD Initiated Contact with Patient 02/19/15 0308     Chief Complaint  Patient presents with  . Dizziness     (Consider location/radiation/quality/duration/timing/severity/associated sxs/prior Treatment) HPI 54 yo male presents to the ER from home with complaint of dizziness.  He reports he has had the dizziness for the last month or more, but it has become worse over the last few weeks.  He describes the dizziness as feeling drunk and off balance.  He denies vertigo or near syncope type symptoms.  Pt was seen for same at urgent care, and was thought to have dehydration and low sugars.  Pt with recent roux en Y for weight loss 6 months ago.  He has noticed that his sugars have been trending lower.  Tonight he was refilling his medications at pharmacy, and realized he hadn't taken his lisinopril in over a week.  He checked his blood pressure and the machine read high.  Advice nurse recommended he be seen in the ER.  No headache, chest pain.  No change to his baseline dizziness.  He has f/u with his endocrinologist next week.  He denies n/v/d, reports eating/drinking well.  No focal weakness, numbness.  No stumbling.   Past Medical History  Diagnosis Date  . Allergy   . Anemia   . Depression   . Diabetes mellitus without complication   . Arthritis   . Clotting disorder     2 unprovoked DVTs yrs ago, lifelong anticoagulation  . DVT (deep venous thrombosis)    Past Surgical History  Procedure Laterality Date  . Vena cava filter placement  2010  . Bariatric surgery     Family History  Problem Relation Age of Onset  . Depression Mother   . Stroke Father   . Heart attack Brother    History  Substance Use Topics  . Smoking status: Never Smoker   . Smokeless tobacco: Not on file  . Alcohol Use: No    Review of Systems    See History of Present Illness; otherwise all other systems are reviewed and  negative Allergies  Lovenox  Home Medications   Prior to Admission medications   Medication Sig Start Date End Date Taking? Authorizing Provider  calcium carbonate (OS-CAL - DOSED IN MG OF ELEMENTAL CALCIUM) 1250 (500 CA) MG tablet Take 1 tablet by mouth 3 (three) times daily.   Yes Historical Provider, MD  Cyanocobalamin (VITAMIN B-12) 2500 MCG SUBL Place 2,500 mcg under the tongue daily.   Yes Historical Provider, MD  glucose blood (ONETOUCH VERIO) test strip Use as instructed twice a day. Dx code: 250.00 03/29/13  Yes Carmelina Dane, MD  Lancets [facility] ULTRASOFT) lancets Use as instructed twice a day. Dx code: 250.00 03/29/13  Yes Carmelina Dane, MD  lisinopril (PRINIVIL,ZESTRIL) 5 MG tablet Take 5 mg by mouth daily.   Yes Historical Provider, MD  metFORMIN (GLUCOPHAGE) 1000 MG tablet Take 500 mg by mouth 2 (two) times daily with a meal.    Yes Historical Provider, MD  Omega-3 Fatty Acids (FISH OIL) 1200 MG CAPS Take 3 capsules by mouth 2 (two) times daily.   Yes Historical Provider, MD  omeprazole (PRILOSEC) 20 MG capsule Take 20 mg by mouth daily.   Yes Historical Provider, MD  rivaroxaban (XARELTO) 10 MG TABS tablet Take 10 mg by mouth daily.   Yes Historical Provider, MD  rosuvastatin (CRESTOR) 20 MG  tablet Take 20 mg by mouth daily.   Yes Historical Provider, MD  Sildenafil Citrate (VIAGRA PO) Take 1 tablet by mouth daily as needed (for erectile dysfunction).   Yes Historical Provider, MD  ursodiol (ACTIGALL) 300 MG capsule Take 300 mg by mouth 2 (two) times daily. 01/12/15  Yes Historical Provider, MD   BP 170/77 mmHg  Pulse 72  Temp(Src) 98.5 F (36.9 C) (Oral)  Resp 20  Ht 6' (1.829 m)  Wt 275 lb (124.739 kg)  BMI 37.29 kg/m2  SpO2 96% Physical Exam  Constitutional: He is oriented to person, place, and time. He appears well-developed and well-nourished.  HENT:  Head: Normocephalic and atraumatic.  Right Ear: External ear normal.  Left Ear: External ear normal.   Nose: Nose normal.  Mouth/Throat: Oropharynx is clear and moist.  Eyes: Conjunctivae and EOM are normal. Pupils are equal, round, and reactive to light.  Neck: Normal range of motion. Neck supple. No JVD present. No tracheal deviation present. No thyromegaly present.  Cardiovascular: Normal rate, regular rhythm, normal heart sounds and intact distal pulses.  Exam reveals no gallop and no friction rub.   No murmur heard. Pulmonary/Chest: Effort normal and breath sounds normal. No stridor. No respiratory distress. He has no wheezes. He has no rales. He exhibits no tenderness.  Abdominal: Soft. Bowel sounds are normal. He exhibits no distension and no mass. There is no tenderness. There is no rebound and no guarding.  Musculoskeletal: Normal range of motion. He exhibits no edema or tenderness.  Lymphadenopathy:    He has no cervical adenopathy.  Neurological: He is alert and oriented to person, place, and time. He displays normal reflexes. No cranial nerve deficit. He exhibits normal muscle tone. Coordination normal.  Skin: Skin is warm and dry. No rash noted. No erythema. No pallor.  Psychiatric: He has a normal mood and affect. His behavior is normal. Judgment and thought content normal.  Nursing note and vitals reviewed.   ED Course  Procedures (including critical care time) Labs Review Labs Reviewed  BASIC METABOLIC PANEL - Abnormal; Notable for the following:    Glucose, Bld 194 (*)    All other components within normal limits  CBC WITH DIFFERENTIAL/PLATELET    Imaging Review Ct Head Wo Contrast  02/19/2015   CLINICAL DATA:  Dizziness, elevated blood pressure.  EXAM: CT HEAD WITHOUT CONTRAST  TECHNIQUE: Contiguous axial images were obtained from the base of the skull through the vertex without intravenous contrast.  COMPARISON:  None.  FINDINGS: No intracranial hemorrhage, mass effect, or midline shift. No hydrocephalus. The basilar cisterns are patent. No evidence of territorial  infarct. No intracranial fluid collection. Calvarium is intact. Minimal opacification of scattered ethmoid air cells, paranasal sinuses are otherwise well-aerated. The mastoid air cells are well aerated.  IMPRESSION: No acute intracranial abnormality.   Electronically Signed   By: Rubye OaksMelanie  Ehinger M.D.   On: 02/19/2015 04:59     EKG Interpretation None      MDM   Final diagnoses:  Dizziness    54 year old male with several weeks history of dizziness described as disequilibrium.  His physical exam shows cerebellar abnormalities.  He has no ataxia.  His labs are normal, aside from slightly elevated glucose.  CT head shows no abnormalities.  Plan for follow-up with his primary care doctor, as well as neurology for further workup.    Marisa Severinlga Remo Kirschenmann, MD 02/19/15 [phone]

## 2015-02-19 NOTE — Discharge Instructions (Signed)

## 2015-02-19 NOTE — ED Notes (Signed)
[facility]

## 2016-04-27 DIAGNOSIS — G8911 Acute pain due to trauma: Secondary | ICD-10-CM | POA: Insufficient documentation

## 2016-04-27 DIAGNOSIS — T310 Burns involving less than 10% of body surface: Secondary | ICD-10-CM

## 2016-04-27 DIAGNOSIS — T2020XA Burn of second degree of head, face, and neck, unspecified site, initial encounter: Secondary | ICD-10-CM | POA: Insufficient documentation

## 2016-04-27 DIAGNOSIS — L299 Pruritus, unspecified: Secondary | ICD-10-CM | POA: Insufficient documentation

## 2016-04-27 DIAGNOSIS — T2220XA Burn of second degree of shoulder and upper limb, except wrist and hand, unspecified site, initial encounter: Secondary | ICD-10-CM | POA: Insufficient documentation

## 2016-04-27 HISTORY — DX: Burns involving less than 10% of body surface: T31.0

## 2017-09-01 ENCOUNTER — Ambulatory Visit (INDEPENDENT_AMBULATORY_CARE_PROVIDER_SITE_OTHER): Payer: BC Managed Care – PPO | Admitting: Podiatry

## 2017-09-01 VITALS — BP 128/70 | HR 86

## 2017-09-01 DIAGNOSIS — L603 Nail dystrophy: Secondary | ICD-10-CM

## 2017-09-01 NOTE — Patient Instructions (Signed)

## 2017-09-03 ENCOUNTER — Telehealth: Payer: Self-pay | Admitting: *Deleted

## 2017-09-03 MED ORDER — HYDROCODONE-ACETAMINOPHEN 5-325 MG PO TABS: 1.0000 | ORAL_TABLET | Freq: Four times a day (QID) | ORAL | 0 refills | Status: DC | PRN

## 2017-09-03 NOTE — Telephone Encounter (Signed)
Yes, that's fine. Rx Norco #30 q6h Dr.Takeshia Wenk

## 2017-09-03 NOTE — Telephone Encounter (Signed)
Pt states he is having a lot of toe pain after his toenail procedure on 09/01/2017, is unable to take NSAID due to Gastric By pass. Left message and routed message to Dr. Logan Bores.

## 2017-09-03 NOTE — Telephone Encounter (Signed)
Yes, that's fine. Rx Norco

## 2017-09-03 NOTE — Telephone Encounter (Signed)
Left message to pick up rx in the [place] office before 4:00pm today.

## 2017-09-05 NOTE — Progress Notes (Signed)
   Subjective: Patient is a 56 year old male with PMHx of T2DM presenting today with a complaint of an injury to the right great toenail that occurred about 2 months ago. He states he fell and hit the toe causing most of the nail to become detached. He states there is a portion of the nail still attached to the nail bed. Patient presents today for further treatment and evaluation.  Past Medical History:  Diagnosis Date  . Allergy   . Anemia   . Arthritis   . Clotting disorder    2 unprovoked DVTs yrs ago, lifelong anticoagulation  . Depression   . Diabetes mellitus without complication   . DVT (deep venous thrombosis)     Objective: Physical Exam General: The patient is alert and oriented x3 in no acute distress.  Dermatology: Stable nail bed noted with healthy granular tissue. Periwound integrity is intact. Borders of the nail bed appeared healthy and viable. Negative for any significant drainage or odor. Skin is warm, dry and supple bilateral lower extremities. Negative for open lesions or macerations.  Vascular: Palpable pedal pulses bilaterally. No edema or erythema noted. Capillary refill within normal limits.  Neurological: Epicritic and protective threshold grossly intact bilaterally.   Musculoskeletal Exam: Range of motion within normal limits to all pedal and ankle joints bilateral. Muscle strength 5/5 in all groups bilateral.   Assessment: #1 dystrophic right great toenail  Plan of Care:  1. Patient was evaluated. 2. Discussed treatment alternatives and plan of care. Explained nail avulsion procedure and post procedure course to patient. 3. Patient opted for temporary total nail avulsion.  4. Prior to procedure, local anesthesia infiltration utilized using 3 ml of a 50:50 mixture of 2% plain lidocaine and 0.5% plain marcaine in a normal hallux block fashion and a betadine prep performed.  5. Total temporary nail avulsion with chemical matrixectomy performed followed by  alcohol flush. 6. Light dressing applied. 7. Return to clinic in 2 weeks.     Felecia Shelling, DPM [facility]  Dr. Felecia Shelling, DPM    [address]                                        [place], [place] [address]                Office [phone]  Fax [phone]

## 2017-09-15 ENCOUNTER — Ambulatory Visit: Payer: BC Managed Care – PPO | Admitting: Podiatry

## 2020-02-26 ENCOUNTER — Ambulatory Visit: Payer: BC Managed Care – PPO | Attending: Internal Medicine

## 2020-02-26 DIAGNOSIS — Z23 Encounter for immunization: Secondary | ICD-10-CM

## 2020-02-26 NOTE — Progress Notes (Signed)
   Covid-19 Vaccination Clinic  Name:  David Weeks    MRN: [id number] DOB: 1961/10/28  02/26/2020  [name] was observed post Covid-19 immunization for 15 minutes without incident. He was provided with Vaccine Information Sheet and instruction to access the V-Safe system.   [name] was instructed to call 911 with any severe reactions post vaccine: Marland Kitchen Difficulty breathing  . Swelling of face and throat  . A fast heartbeat  . A bad rash all over body  . Dizziness and weakness   Immunizations Administered    Name Date Dose VIS Date Route   Pfizer COVID-19 Vaccine 02/26/2020  4:19 PM 0.3 mL 11/24/2019 Intramuscular   Manufacturer: [employer], [employer]   Lot: [id number]   NDC: [address]-0

## 2020-03-20 ENCOUNTER — Ambulatory Visit: Payer: BC Managed Care – PPO | Attending: Internal Medicine

## 2020-03-20 DIAGNOSIS — Z23 Encounter for immunization: Secondary | ICD-10-CM

## 2020-03-20 NOTE — Progress Notes (Signed)
   Covid-19 Vaccination Clinic  Name:  David Weeks    MRN: [id number] DOB: January 25, 1961  03/20/2020  [name] was observed post Covid-19 immunization for 15 minutes without incident. He was provided with Vaccine Information Sheet and instruction to access the V-Safe system.   [name] was instructed to call 911 with any severe reactions post vaccine: Marland Kitchen Difficulty breathing  . Swelling of face and throat  . A fast heartbeat  . A bad rash all over body  . Dizziness and weakness   Immunizations Administered    Name Date Dose VIS Date Route   Pfizer COVID-19 Vaccine 03/20/2020  3:24 PM 0.3 mL 11/24/2019 Intramuscular   Manufacturer: [employer], [employer]   Lot: [id number]   NDC: [address]-8

## 2022-04-22 ENCOUNTER — Emergency Department (HOSPITAL_COMMUNITY): Payer: BC Managed Care – PPO

## 2022-04-22 ENCOUNTER — Encounter (HOSPITAL_COMMUNITY): Payer: Self-pay | Admitting: Emergency Medicine

## 2022-04-22 ENCOUNTER — Observation Stay (HOSPITAL_COMMUNITY)
Admission: EM | Admit: 2022-04-22 | Discharge: 2022-04-23 | Disposition: A | Discharge status: Home / Self Care | Payer: BC Managed Care – PPO | Attending: Internal Medicine | Admitting: Internal Medicine

## 2022-04-22 ENCOUNTER — Other Ambulatory Visit: Payer: Self-pay

## 2022-04-22 DIAGNOSIS — Z79899 Other long term (current) drug therapy: Secondary | ICD-10-CM | POA: Diagnosis not present

## 2022-04-22 DIAGNOSIS — Z86718 Personal history of other venous thrombosis and embolism: Secondary | ICD-10-CM | POA: Insufficient documentation

## 2022-04-22 DIAGNOSIS — R42 Dizziness and giddiness: Secondary | ICD-10-CM | POA: Insufficient documentation

## 2022-04-22 DIAGNOSIS — Z7984 Long term (current) use of oral hypoglycemic drugs: Secondary | ICD-10-CM | POA: Diagnosis not present

## 2022-04-22 DIAGNOSIS — D6859 Other primary thrombophilia: Secondary | ICD-10-CM | POA: Diagnosis present

## 2022-04-22 DIAGNOSIS — I10 Essential (primary) hypertension: Secondary | ICD-10-CM | POA: Insufficient documentation

## 2022-04-22 DIAGNOSIS — Z7901 Long term (current) use of anticoagulants: Secondary | ICD-10-CM | POA: Insufficient documentation

## 2022-04-22 DIAGNOSIS — Z9884 Bariatric surgery status: Secondary | ICD-10-CM

## 2022-04-22 DIAGNOSIS — I2699 Other pulmonary embolism without acute cor pulmonale: Secondary | ICD-10-CM | POA: Diagnosis not present

## 2022-04-22 DIAGNOSIS — Z86711 Personal history of pulmonary embolism: Secondary | ICD-10-CM | POA: Diagnosis not present

## 2022-04-22 DIAGNOSIS — R0602 Shortness of breath: Secondary | ICD-10-CM | POA: Diagnosis present

## 2022-04-22 DIAGNOSIS — E119 Type 2 diabetes mellitus without complications: Secondary | ICD-10-CM | POA: Diagnosis not present

## 2022-04-22 LAB — CBC WITH DIFFERENTIAL/PLATELET
Abs Immature Granulocytes: 0.01 10*3/uL (ref 0.00–0.07)
Basophils Absolute: 0.1 10*3/uL (ref 0.0–0.1)
Basophils Relative: 1 %
Eosinophils Absolute: 0.3 10*3/uL (ref 0.0–0.5)
Eosinophils Relative: 4 %
HCT: 42 % (ref 39.0–52.0)
Hemoglobin: 14 g/dL (ref 13.0–17.0)
Immature Granulocytes: 0 %
Lymphocytes Relative: 19 %
Lymphs Abs: 1.5 10*3/uL (ref 0.7–4.0)
MCH: 30.2 pg (ref 26.0–34.0)
MCHC: 33.3 g/dL (ref 30.0–36.0)
MCV: 90.5 fL (ref 80.0–100.0)
Monocytes Absolute: 0.5 10*3/uL (ref 0.1–1.0)
Monocytes Relative: 6 %
Neutro Abs: 5.3 10*3/uL (ref 1.7–7.7)
Neutrophils Relative %: 70 %
Platelets: 213 10*3/uL (ref 150–400)
RBC: 4.64 MIL/uL (ref 4.22–5.81)
RDW: 13.1 % (ref 11.5–15.5)
WBC: 7.6 10*3/uL (ref 4.0–10.5)
nRBC: 0 % (ref 0.0–0.2)

## 2022-04-22 LAB — TROPONIN I (HIGH SENSITIVITY)
Troponin I (High Sensitivity): 5 ng/L (ref <18)
Troponin I (High Sensitivity): 4 ng/L (ref <18)

## 2022-04-22 LAB — BASIC METABOLIC PANEL
Anion gap: 7 (ref 5–15)
BUN: 7 mg/dL — ABNORMAL LOW (ref 8–23)
CO2: 25 mmol/L (ref 22–32)
Calcium: 9.3 mg/dL (ref 8.9–10.3)
Chloride: 102 mmol/L (ref 98–111)
Creatinine, Ser: 0.77 mg/dL (ref 0.61–1.24)
GFR, Estimated: >60 mL/min (ref >60)
Glucose, Bld: 225 mg/dL — ABNORMAL HIGH (ref 70–99)
Potassium: 4.8 mmol/L (ref 3.5–5.1)
Sodium: 134 mmol/L — ABNORMAL LOW (ref 135–145)

## 2022-04-22 LAB — CBG MONITORING, ED: Glucose-Capillary: 211 mg/dL — ABNORMAL HIGH (ref 70–99)

## 2022-04-22 MED ORDER — EMPAGLIFLOZIN 10 MG PO TABS
10.0000 mg | ORAL_TABLET | Freq: Every day | ORAL | Status: DC
  Administered 2022-04-23: 10 mg via ORAL
  Filled 2022-04-22: qty 1

## 2022-04-22 MED ORDER — ACETAMINOPHEN 650 MG RE SUPP: 650.0000 mg | Freq: Four times a day (QID) | RECTAL | Status: DC | PRN

## 2022-04-22 MED ORDER — ONDANSETRON HCL 4 MG/2ML IJ SOLN: 4.0000 mg | Freq: Four times a day (QID) | INTRAMUSCULAR | Status: DC | PRN

## 2022-04-22 MED ORDER — ACETAMINOPHEN 325 MG PO TABS
650.0000 mg | ORAL_TABLET | Freq: Four times a day (QID) | ORAL | Status: DC | PRN
  Administered 2022-04-22 – 2022-04-23 (×2): 650 mg via ORAL
  Filled 2022-04-22 (×2): qty 2

## 2022-04-22 MED ORDER — ROSUVASTATIN CALCIUM 20 MG PO TABS
20.0000 mg | ORAL_TABLET | Freq: Every day | ORAL | Status: DC
  Administered 2022-04-22 – 2022-04-23 (×2): 20 mg via ORAL
  Filled 2022-04-22 (×2): qty 1

## 2022-04-22 MED ORDER — INSULIN ASPART 100 UNIT/ML IJ SOLN
0.0000 [IU] | Freq: Three times a day (TID) | INTRAMUSCULAR | Status: DC
  Administered 2022-04-23: 2 [IU] via SUBCUTANEOUS

## 2022-04-22 MED ORDER — INSULIN ASPART 100 UNIT/ML IJ SOLN
0.0000 [IU] | Freq: Every day | INTRAMUSCULAR | Status: DC
  Administered 2022-04-22: 2 [IU] via SUBCUTANEOUS

## 2022-04-22 MED ORDER — SODIUM CHLORIDE 0.9 % IV BOLUS
1000.0000 mL | Freq: Once | INTRAVENOUS | Status: AC
  Administered 2022-04-22: 1000 mL via INTRAVENOUS

## 2022-04-22 MED ORDER — MELATONIN 5 MG PO TABS
10.0000 mg | ORAL_TABLET | Freq: Every evening | ORAL | Status: DC | PRN
  Filled 2022-04-22: qty 2

## 2022-04-22 MED ORDER — NICOTINE 14 MG/24HR TD PT24
14.0000 mg | MEDICATED_PATCH | Freq: Every day | TRANSDERMAL | Status: DC
  Filled 2022-04-22: qty 1

## 2022-04-22 MED ORDER — ONDANSETRON HCL 4 MG PO TABS: 4.0000 mg | ORAL_TABLET | Freq: Four times a day (QID) | ORAL | Status: DC | PRN

## 2022-04-22 MED ORDER — IOHEXOL 350 MG/ML SOLN
80.0000 mL | Freq: Once | INTRAVENOUS | Status: AC | PRN
  Administered 2022-04-22: 80 mL via INTRAVENOUS

## 2022-04-22 MED ORDER — RIVAROXABAN 10 MG PO TABS: 20.0000 mg | ORAL_TABLET | Freq: Every day | ORAL | Status: DC

## 2022-04-22 MED ORDER — LISINOPRIL 10 MG PO TABS
10.0000 mg | ORAL_TABLET | Freq: Every day | ORAL | Status: DC
  Administered 2022-04-23: 10 mg via ORAL
  Filled 2022-04-22: qty 1

## 2022-04-22 MED ORDER — RIVAROXABAN 15 MG PO TABS
15.0000 mg | ORAL_TABLET | Freq: Two times a day (BID) | ORAL | Status: DC
  Administered 2022-04-22 – 2022-04-23 (×2): 15 mg via ORAL
  Filled 2022-04-22 (×3): qty 1

## 2022-04-22 NOTE — Assessment & Plan Note (Signed)
Admit to observation medical telemetry bed.  Start Xarelto 15 mg twice daily.  This is not a treatment failure due to the patient not taking Xarelto for at least 30 days.  Check echo in the morning.  Patient remains on room air.  Likely able to discharge tomorrow.  Patient already has an IVC filter.  Lower extremity ultrasounds would not change management of the patient's PE. ?

## 2022-04-22 NOTE — ED Notes (Signed)
Transported to CT 

## 2022-04-22 NOTE — Assessment & Plan Note (Signed)
Continue Jardiance 10 mg daily.  Add sliding scale insulin.  Hold metformin. ?

## 2022-04-22 NOTE — Assessment & Plan Note (Signed)
Patient has known history of PE.  He states that did not notice that his refill for Xarelto was not given to him when he went to the pharmacy to pick up his refills.  He does not use a pillbox at home.  He has been without Xarelto for at least 30 days now. ?

## 2022-04-22 NOTE — Assessment & Plan Note (Signed)
Stable.  Has been on anticoagulation for the last 13 years. ?

## 2022-04-22 NOTE — Assessment & Plan Note (Signed)
Stable.  Patient states that he lost 190 pounds after his gastric surgery. ?

## 2022-04-22 NOTE — ED Provider Triage Note (Signed)
Emergency Medicine Provider Triage Evaluation Note ? ?Bing Quarry , a 61 y.o. male  was evaluated in triage.  Pt complains of dizziness/lightheadedness since this morning.  Patient was bent over cleaning his car when he rose up.  He has been dizzy since then.  He took a nap without improvement of his symptoms.  Patient states while he was ambulating into the emergency room he became lightheaded and had to sit down and required wheelchair.  He also endorses shortness of breath.  Denies chest pain.  He has history of DVT, PEs.  He is supposed to be on anticoagulation.  Reports noncompliance with his Xarelto for over a month now.  Denies lower extremity pain or swelling. ? ?Review of Systems  ?Positive: As above ?Negative: As above ? ?Physical Exam  ?BP 103/88   Pulse 83   Temp 98 ?F (36.7 ?C)   Resp 15   SpO2 100%  ?Gen:   Awake, no distress   ?Resp:  Normal effort  ?MSK:   Moves extremities without difficulty ?Other:   ? ?Medical Decision Making  ?Medically screening exam initiated at 2:09 PM.  Appropriate orders placed.  LEONA PRESSLY was informed that the remainder of the evaluation will be completed by another provider, this initial triage assessment does not replace that evaluation, and the importance of remaining in the ED until their evaluation is complete. ? ? ?  ?Marita Kansas, PA-C ?04/22/22 1412 ? ?

## 2022-04-22 NOTE — ED Triage Notes (Signed)
Pt presents sent from urgent care after episode of dizziness and shob this morning. Hx of blood clots Realized this morning he has not taken his blood thinner in a month.  No LOC  No fall.  ?

## 2022-04-22 NOTE — ED Notes (Signed)
PT ambulatory to bathroom with no noted difficulties  ?

## 2022-04-22 NOTE — ED Provider Notes (Signed)
?[facility] EMERGENCY DEPARTMENT ?Provider Note ? ? ?CSN: [id number] ?Arrival date & time: 04/22/22  1322 ? ?  ? ?History ? ?Chief Complaint  ?Patient presents with  ? Shortness of Breath  ? ? ?David Weeks is a 61 y.o. male. ? ?The history is provided by the patient and medical records. No language interpreter was used.  ?Shortness of Breath ? 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  Patient report this morning he was trying to clean out his car when he stood up and felt dizzy.  He described as a room spinning sensation as if he was going to pass out and also endorsed having some shortness of breath.  This episode lasted for several minutes, much longer than what he anticipated.  He called out of work, and called his PCP who is recommend patient come to the urgent care center for further evaluation.  He went to urgent care center but due to his history of prior PE and DVT and having been taking his anticoagulant for approximately a month, they recommend patient to come to the ER.  Upon walking from his car to the ER patient states he became lightheadedness and dizzy again with associate shortness of breath and had to sit down.  At rest, symptoms seem to dissipate.  Does not endorse any significant fever, headache, diplopia, ringing in the ear, chest pain, productive cough, hemoptysis, back pain, abdominal pain, focal numbness or focal weakness.  Denies any leg swelling or calf pain.  Denies pleuritic chest pain. ? ?Home Medications ?Prior to Admission medications   ?Medication Sig Start Date End Date Taking? Authorizing Provider  ?amphetamine-dextroamphetamine (ADDERALL) 10 MG tablet  07/21/17   [provider]  ?Calcium Carb-Cholecalciferol (CALCIUM-VITAMIN D) 500-200 MG-UNIT tablet Take by mouth.    [provider]   ?eszopiclone (LUNESTA) 2 MG TABS tablet  08/28/17   [provider]  ?gabapentin (NEURONTIN) 100 MG capsule TAKE ONE CAPSULE BY MOUTH THREE TIMES A DAY 04/26/17   [provider]  ?glucose blood (ONETOUCH VERIO) test strip Check BG 1 time/day.  Dx E11.65 07/21/17   [provider]  ?HYDROcodone-acetaminophen [facility]) 10-325 MG tablet  07/21/17   [provider]  ?HYDROcodone-acetaminophen (NORCO) 5-325 MG tablet Take 1 tablet by mouth every 6 (six) hours as needed for moderate pain. 09/03/17   Felecia Shelling, DPM  ?JARDIANCE 10 MG TABS tablet  08/28/17   [provider]  ?lisinopril (PRINIVIL,ZESTRIL) 10 MG tablet  08/28/17   [provider]  ?lisinopril (PRINIVIL,ZESTRIL) 10 MG tablet TAKE ONE TABLET BY MOUTH DAILY 07/21/17   [provider]  ?metFORMIN (GLUCOPHAGE) 1000 MG tablet  08/28/17   [provider]  ?Multiple Vitamin (MULTI-VITAMINS) TABS Take by mouth.    [provider]  ?Dola Argyle LANCETS 33G MISC  07/21/17   [provider]  ?rosuvastatin (CRESTOR) 20 MG tablet TAKE ONE TABLET BY MOUTH DAILY 07/21/17   [provider]  ?sildenafil (REVATIO) 20 MG tablet Take 2 to 3 pills prior to intercourse 07/21/17   [provider]  ?Carlena Hurl 20 MG TABS tablet  07/19/17   [provider]  ?   ? ?Allergies    ?Lovenox [enoxaparin]   ? ?Review of Systems   ?Review of Systems  ?Respiratory:  Positive for shortness of breath.   ?All other systems  reviewed and are negative. ? ?Physical Exam ?Updated Vital Signs ?BP 103/88   Pulse 83   Temp 98 ?F (36.7 ?C)   Resp 15   SpO2 100%  ?Physical Exam ?Vitals and nursing note reviewed.  ?Constitutional:   ?   General: He is not in acute distress. ?   Appearance: He is well-developed.  ?HENT:  ?   Head: Atraumatic.  ?Eyes:  ?   Extraocular Movements: Extraocular movements intact.  ?   Conjunctiva/sclera: Conjunctivae normal.  ?   Pupils: Pupils are equal, round, and reactive  to light.  ?   Comments: No nystagmus  ?Cardiovascular:  ?   Rate and Rhythm: Normal rate and regular rhythm.  ?   Pulses: Normal pulses.  ?   Heart sounds: Normal heart sounds.  ?Pulmonary:  ?   Effort: Pulmonary effort is normal.  ?   Breath sounds: Normal breath sounds.  ?Abdominal:  ?   General: Abdomen is flat.  ?   Palpations: Abdomen is soft.  ?   Tenderness: There is no abdominal tenderness.  ?Musculoskeletal:  ?   Cervical back: Neck supple.  ?   Right lower leg: No edema.  ?   Left lower leg: No edema.  ?Skin: ?   Findings: No rash.  ?Neurological:  ?   Mental Status: He is alert and oriented to person, place, and time.  ?Psychiatric:     ?   Mood and Affect: Mood normal.  ? ? ?ED Results / Procedures / Treatments   ?Labs ?(all labs ordered are listed, but only abnormal results are displayed) ?Labs Reviewed  ?BASIC METABOLIC PANEL - Abnormal; Notable for the following components:  ?    Result Value  ? Sodium 134 (*)   ? Glucose, Bld 225 (*)   ? BUN 7 (*)   ? All other components within normal limits  ?CBC WITH DIFFERENTIAL/PLATELET  ?TROPONIN I (HIGH SENSITIVITY)  ?TROPONIN I (HIGH SENSITIVITY)  ? ? ?EKG ?EKG Interpretation ? ?Date/Time:  Wednesday Apr 22 2022 13:54:07 EDT ?Ventricular Rate:  79 ?PR Interval:  140 ?QRS Duration: 84 ?QT Interval:  368 ?QTC Calculation: 421 ?R Axis:   113 ?Text Interpretation: Normal sinus rhythm Left posterior fascicular block Abnormal ECG Confirmed by Benjiman Core [phone]) on 04/22/2022 6:30:30 PM ? ? ?Radiology ?DG Chest 2 View ? ?Result Date: 04/22/2022 ?CLINICAL DATA:  Dyspnea EXAM: CHEST - 2 VIEW COMPARISON:  03/29/2013 FINDINGS: The heart size and mediastinal contours are within normal limits. Both lungs are clear. The visualized skeletal structures are unremarkable. IMPRESSION: No active cardiopulmonary disease. Electronically Signed   By: Ernie Avena M.D.   On: 04/22/2022 14:58  ? ?CT Head Wo Contrast ? ?Result Date: 04/22/2022 ?CLINICAL DATA:  Dizziness.  EXAM: CT HEAD WITHOUT CONTRAST TECHNIQUE: Contiguous axial images were obtained from the base of the skull through the vertex without intravenous contrast. RADIATION DOSE REDUCTION: This exam was performed according to the departmental dose-optimization program which includes automated exposure control, adjustment of the mA and/or kV according to patient size and/or use of iterative reconstruction technique. COMPARISON:  February 19, 2015. FINDINGS: Brain: No evidence of acute infarction, hemorrhage, hydrocephalus, extra-axial collection or mass lesion/mass effect. Vascular: No hyperdense vessel or unexpected calcification. Skull: Normal. Negative for fracture or focal lesion. Sinuses/Orbits: No acute finding. Other: None. IMPRESSION: No acute intracranial abnormality seen. Electronically Signed   By: Lupita Raider M.D.   On: 04/22/2022 15:05   ? ?Procedures ?Procedures  ? ? ?  Medications Ordered in ED ?Medications  ?sodium chloride 0.9 % bolus 1,000 mL (1,000 mLs Intravenous New Bag/Given 04/22/22 1708)  ? ? ?ED Course/ Medical Decision Making/ A&P ?  ?                        ?Medical Decision Making ? ?BP 103/88   Pulse 83   Temp 98 ?F (36.7 ?C)   Resp 15   SpO2 100%  ? ?4:56 PM ?This is a 61 year old male with history of prior DVT/PE, who have not been taking his Xarelto for approximately a month presenting with episodes of dizziness/lightheadedness with associated shortness of breath upon exertion throughout the day today.  He denies any active chest pain and denies any hemoptysis or cough.  He was noted to have a blood pressure of 103/88 with a heart rate of 83.  Patient overall well-appearing.  He does not have any nystagmus to suggest vertigo.  He is at high risk for PE therefore chest CTA have been ordered. IVF given ? ?6:30 PM ?Labs and imaging and EKG independently viewed interpreted by me and I agree with radiologist interpretation.  Patient has normal delta troponin, normal WBC, normal H&H, CBG is 225  without any anion gap.  Head CT scan without any acute intracranial changes.  Chest x-ray showed no active cardiopulmonary disease.  EKG showing a left posterior fascicular block, unchanged from prior.  Chest CTA

## 2022-04-22 NOTE — Subjective & Objective (Signed)
Chief complaint: near syncope, shortness of breath ?History present illness: ?61 year old white male with a history of pulmonary embolism, history of primary hypercoagulable state, type 2 diabetes, hypertension presents to the ER today with a 1 day history of feeling short of breath, having a near syncopal episode in the parking lot.  Patient realized today that he has not been taking his Xarelto for at least 3 days.  He states that the last time he got his prescriptions filled at the pharmacy, he did not notice that he was not given a refill for his Xarelto.  He states he does not use a pillbox at home.  He takes his medications straight out of the bottle each night.  He states that it all of his medicines are in a cardboard box and he usually just takes 1 pill from each bottle.  He did not notice that he has been missing his Xarelto prescription for at least a month now. ? ?Patient seen in the urgent care center.  He was directed to the ER due to symptoms of shortness of breath and a possibility for PE. ? ?On the walk into the ER, he had a near syncopal episode. ? ?On arrival temp 98.0 heart rate 83 blood pressure 103/88. ? ?CTPA demonstrates multiple PEs. ? ?Patient remains on room air. ? ?[facility] hospitalist contacted for admission. ? ?
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
## 2022-04-23 ENCOUNTER — Observation Stay (HOSPITAL_BASED_OUTPATIENT_CLINIC_OR_DEPARTMENT_OTHER): Payer: BC Managed Care – PPO

## 2022-04-23 DIAGNOSIS — R0602 Shortness of breath: Secondary | ICD-10-CM | POA: Diagnosis not present

## 2022-04-23 DIAGNOSIS — I2699 Other pulmonary embolism without acute cor pulmonale: Secondary | ICD-10-CM | POA: Diagnosis not present

## 2022-04-23 DIAGNOSIS — R55 Syncope and collapse: Secondary | ICD-10-CM | POA: Diagnosis not present

## 2022-04-23 LAB — ECHOCARDIOGRAM COMPLETE
Area-P 1/2: 3.17 cm2
S' Lateral: 2.8 cm

## 2022-04-23 LAB — CBG MONITORING, ED
Glucose-Capillary: 176 mg/dL — ABNORMAL HIGH (ref 70–99)
Glucose-Capillary: 113 mg/dL — ABNORMAL HIGH (ref 70–99)

## 2022-04-23 MED ORDER — NICOTINE 14 MG/24HR TD PT24
14.0000 mg | MEDICATED_PATCH | Freq: Every day | TRANSDERMAL | 0 refills | Status: DC
Start: 2022-04-24 — End: 2024-08-16

## 2022-04-23 MED ORDER — RIVAROXABAN (XARELTO) VTE STARTER PACK (15 & 20 MG): ORAL_TABLET | ORAL | 0 refills | Status: AC

## 2022-04-23 NOTE — ED Notes (Signed)
Patient ambulatory around unit without assistance  ?

## 2022-04-23 NOTE — Progress Notes (Signed)
?  Echocardiogram ?2D Echocardiogram has been performed. ? ?Delcie Roch ?04/23/2022, 2:47 PM ?

## 2022-04-23 NOTE — Discharge Summary (Signed)
Physician Discharge Summary  ?David Weeks NAT:[id number] DOB: 1961-10-17 DOA: 04/22/2022 ? ?PCP: Barbie Banner, MD ? ?Admit date: 04/22/2022 ?Discharge date: 04/23/2022 ? ?Admitted From: Home ?Disposition: Home ? ?Recommendations for Outpatient Follow-up:  ?Follow up with PCP in 1-2 weeks ?Take blood thinner without interruption lifelong. ? ?Home Health: N/A ?Equipment/Devices: N/A ? ?Discharge Condition: Stable ?CODE STATUS: Full code ?Diet recommendation:  ?Low-salt and low-carb diet ? ?Discharge summary: ?61 year old gentleman with history of multiple pulmonary embolism and primary hypercoagulable state, type 2 diabetes and hypertension presented to the ER with 1 day of shortness of breath and near syncopal episode in the parking lot after missing about a month of Xarelto.  In the emergency room, he was found to have multiple pulmonary embolism with mild right ventricular strain but he was hemodynamically stable.  He was admitted for observation due to significant clot burden.  Started back on loading dose of Xarelto.  2D echocardiogram with no right ventricular strain, normal ejection fraction, no intracardiac thrombus. ? ?Patient remained hemodynamically stable.  He is mobilizing around the hallway on room air without shortness of breath or recurrent symptoms.  Telemetry with no arrhythmias.  Since patient is hemodynamically stable, on room air and asymptomatic, he will be discharged home.  He has to go back on loading dose of Xarelto.  Multiple PEs on interruption of therapies, patient was counseled about lifelong anticoagulation need and he is educated about this.  Continue all long-term home medications. ? ?Discharge Diagnoses:  ?Principal Problem: ?  Pulmonary embolism (HCC) ?Active Problems: ?  Controlled type 2 diabetes mellitus without complication, without long-term current use of insulin (HCC) ?  History of pulmonary embolism ?  Primary hypercoagulable state (HCC) ?  S/P bariatric  surgery ? ? ? ?Discharge Instructions ? ?Discharge Instructions   ? ? Call MD for:  difficulty breathing, headache or visual disturbances   Complete by: As directed ?  ? Call MD for:  extreme fatigue   Complete by: As directed ?  ? Diet - low sodium heart healthy   Complete by: As directed ?  ? Diet Carb Modified   Complete by: As directed ?  ? Increase activity slowly   Complete by: As directed ?  ? ?  ? ?Allergies as of 04/23/2022   ? ?   Reactions  ? Lovenox [enoxaparin] Rash  ? ?  ? ?  ?Medication List  ?  ? ?STOP taking these medications   ? ?calcium-vitamin D 500-200 MG-UNIT tablet ?  ?eszopiclone 2 MG Tabs tablet ?Commonly known as: LUNESTA ?  ?glucose blood test strip ?  ?HYDROcodone-acetaminophen 10-325 MG tablet ?Commonly known as: NORCO ?  ?HYDROcodone-acetaminophen 5-325 MG tablet ?Commonly known as: Norco ?  ?OneTouch Delica Lancets 33G Misc ?  ?Xarelto 20 MG Tabs tablet ?Generic drug: rivaroxaban ?Replaced by: Rivaroxaban Stater Pack (15 mg and 20 mg) ?  ? ?  ? ?TAKE these medications   ? ?acetaminophen 500 MG tablet ?Commonly known as: TYLENOL ?Take 1,500 mg by mouth every 6 (six) hours as needed for moderate pain or headache. ?  ?amphetamine-dextroamphetamine 10 MG tablet ?Commonly known as: ADDERALL ?Take 10 mg by mouth daily as needed (focus). ?  ?gabapentin 300 MG capsule ?Commonly known as: NEURONTIN ?Take 300 mg by mouth 3 (three) times daily. ?What changed: Another medication with the same name was removed. Continue taking this medication, and follow the directions you see here. ?  ?Jardiance 10 MG Tabs tablet ?Generic drug: empagliflozin ?Take 10  mg by mouth every evening. ?  ?lisinopril 10 MG tablet ?Commonly known as: ZESTRIL ?Take 10 mg by mouth daily. ?What changed: Another medication with the same name was removed. Continue taking this medication, and follow the directions you see here. ?  ?metFORMIN 1000 MG tablet ?Commonly known as: GLUCOPHAGE ?Take 1,000 mg by mouth 2 (two) times daily  with a meal. ?  ?Multi-Vitamins Tabs ?Take 1 tablet by mouth at bedtime. ?  ?nicotine 14 mg/24hr patch ?Commonly known as: NICODERM CQ - dosed in mg/24 hours ?Place 1 patch (14 mg total) onto the skin daily. ?Start taking on: Apr 24, 2022 ?  ?Ozempic (1 MG/DOSE) 4 MG/3ML Sopn ?Generic drug: Semaglutide (1 MG/DOSE) ?Inject 1 mg into the skin every Sunday. ?  ?Rivaroxaban Stater Pack (15 mg and 20 mg) ?Commonly known as: XARELTO STARTER PACK ?Follow package directions: Take one 15mg  tablet by mouth twice a day. On day 22, switch to one 20mg  tablet once a day. Take with food. ?Replaces: Xarelto 20 MG Tabs tablet ?  ?rosuvastatin 20 MG tablet ?Commonly known as: CRESTOR ?Take 20 mg by mouth at bedtime. ?  ?sildenafil 20 MG tablet ?Commonly known as: REVATIO ?Take 60-80 mg by mouth daily as needed (prior to intercourse). ?  ?traZODone 50 MG tablet ?Commonly known as: DESYREL ?Take 50 mg by mouth at bedtime as needed for sleep. ?  ? ?  ? ? ?Allergies  ?Allergen Reactions  ? Lovenox [Enoxaparin] Rash  ? ? ?Consultations: ?None ? ? ?Procedures/Studies: ?DG Chest 2 View ? ?Result Date: 04/22/2022 ?CLINICAL DATA:  Dyspnea EXAM: CHEST - 2 VIEW COMPARISON:  03/29/2013 FINDINGS: The heart size and mediastinal contours are within normal limits. Both lungs are clear. The visualized skeletal structures are unremarkable. IMPRESSION: No active cardiopulmonary disease. Electronically Signed   By: Ernie AvenaPalani  Rathinasamy M.D.   On: 04/22/2022 14:58  ? ?CT Head Wo Contrast ? ?Result Date: 04/22/2022 ?CLINICAL DATA:  Dizziness. EXAM: CT HEAD WITHOUT CONTRAST TECHNIQUE: Contiguous axial images were obtained from the base of the skull through the vertex without intravenous contrast. RADIATION DOSE REDUCTION: This exam was performed according to the departmental dose-optimization program which includes automated exposure control, adjustment of the mA and/or kV according to patient size and/or use of iterative reconstruction technique. COMPARISON:   February 19, 2015. FINDINGS: Brain: No evidence of acute infarction, hemorrhage, hydrocephalus, extra-axial collection or mass lesion/mass effect. Vascular: No hyperdense vessel or unexpected calcification. Skull: Normal. Negative for fracture or focal lesion. Sinuses/Orbits: No acute finding. Other: None. IMPRESSION: No acute intracranial abnormality seen. Electronically Signed   By: Lupita RaiderJames  Green Jr M.D.   On: 04/22/2022 15:05  ? ?CT Angio Chest PE W and/or Wo Contrast ? ?Result Date: 04/22/2022 ?CLINICAL DATA:  Dizziness and lightheaded. EXAM: CT ANGIOGRAPHY CHEST WITH CONTRAST TECHNIQUE: Multidetector CT imaging of the chest was performed using the standard protocol during bolus administration of intravenous contrast. Multiplanar CT image reconstructions and MIPs were obtained to evaluate the vascular anatomy. RADIATION DOSE REDUCTION: This exam was performed according to the departmental dose-optimization program which includes automated exposure control, adjustment of the mA and/or kV according to patient size and/or use of iterative reconstruction technique. CONTRAST:  80mL OMNIPAQUE IOHEXOL 350 MG/ML SOLN COMPARISON:  July 29, 2009 FINDINGS: Cardiovascular: Marked severity areas of intraluminal low attenuation are seen involving multiple upper lobe, middle lobe and lower lobe branches of the bilateral pulmonary arteries. Normal heart size with mild to moderate severity coronary artery calcification. There is mild right  heart strain with an RV/LV ratio of 1.04. No pericardial effusion. Mediastinum/Nodes: No enlarged mediastinal, hilar, or axillary lymph nodes. Thyroid gland, trachea, and esophagus demonstrate no significant findings. Lungs/Pleura: Lungs are clear. No pleural effusion or pneumothorax. Upper Abdomen: Multiple surgical sutures are seen along the gastric region. A thin layer of subcentimeter gallstones is seen within the dependent portion of an otherwise normal-appearing gallbladder. An inferior  vena cava filter is in place. Musculoskeletal: Multilevel degenerative changes are seen throughout the thoracic spine. Review of the MIP images confirms the above findings. IMPRESSION: 1. Marked severity bilateral pulmonary

## 2022-04-23 NOTE — ED Notes (Signed)
Patient plan to discharge after echo has been done. MD and charge made aware since patient was accepted for an inpatient bed ?

## 2022-08-31 ENCOUNTER — Ambulatory Visit: Payer: BC Managed Care – PPO | Admitting: Dermatology

## 2023-05-28 ENCOUNTER — Encounter (HOSPITAL_COMMUNITY): Payer: Self-pay | Admitting: *Deleted

## 2023-05-28 ENCOUNTER — Other Ambulatory Visit: Payer: Self-pay

## 2023-05-28 ENCOUNTER — Emergency Department (HOSPITAL_COMMUNITY)
Admission: EM | Admit: 2023-05-28 | Discharge: 2023-05-28 | Disposition: A | Discharge status: Home / Self Care | Payer: BC Managed Care – PPO | Attending: Emergency Medicine | Admitting: Emergency Medicine

## 2023-05-28 ENCOUNTER — Emergency Department (HOSPITAL_COMMUNITY): Payer: BC Managed Care – PPO

## 2023-05-28 DIAGNOSIS — R109 Unspecified abdominal pain: Secondary | ICD-10-CM | POA: Diagnosis present

## 2023-05-28 DIAGNOSIS — Z794 Long term (current) use of insulin: Secondary | ICD-10-CM | POA: Diagnosis not present

## 2023-05-28 DIAGNOSIS — Z7901 Long term (current) use of anticoagulants: Secondary | ICD-10-CM | POA: Diagnosis not present

## 2023-05-28 DIAGNOSIS — R1010 Upper abdominal pain, unspecified: Secondary | ICD-10-CM

## 2023-05-28 DIAGNOSIS — R1084 Generalized abdominal pain: Secondary | ICD-10-CM | POA: Insufficient documentation

## 2023-05-28 LAB — CBC WITH DIFFERENTIAL/PLATELET
Abs Immature Granulocytes: 0.01 10*3/uL (ref 0.00–0.07)
Basophils Absolute: 0.1 10*3/uL (ref 0.0–0.1)
Basophils Relative: 1 %
Eosinophils Absolute: 0.2 10*3/uL (ref 0.0–0.5)
Eosinophils Relative: 3 %
HCT: 44.1 % (ref 39.0–52.0)
Hemoglobin: 14.7 g/dL (ref 13.0–17.0)
Immature Granulocytes: 0 %
Lymphocytes Relative: 25 %
Lymphs Abs: 1.6 10*3/uL (ref 0.7–4.0)
MCH: 29.2 pg (ref 26.0–34.0)
MCHC: 33.3 g/dL (ref 30.0–36.0)
MCV: 87.5 fL (ref 80.0–100.0)
Monocytes Absolute: 0.5 10*3/uL (ref 0.1–1.0)
Monocytes Relative: 7 %
Neutro Abs: 4 10*3/uL (ref 1.7–7.7)
Neutrophils Relative %: 64 %
Platelets: 256 10*3/uL (ref 150–400)
RBC: 5.04 MIL/uL (ref 4.22–5.81)
RDW: 13 % (ref 11.5–15.5)
WBC: 6.3 10*3/uL (ref 4.0–10.5)
nRBC: 0 % (ref 0.0–0.2)

## 2023-05-28 LAB — COMPREHENSIVE METABOLIC PANEL
ALT: 31 U/L (ref 0–44)
AST: 28 U/L (ref 15–41)
Albumin: 4.1 g/dL (ref 3.5–5.0)
Alkaline Phosphatase: 47 U/L (ref 38–126)
Anion gap: 10 (ref 5–15)
BUN: 16 mg/dL (ref 8–23)
CO2: 25 mmol/L (ref 22–32)
Calcium: 9.4 mg/dL (ref 8.9–10.3)
Chloride: 100 mmol/L (ref 98–111)
Creatinine, Ser: 0.79 mg/dL (ref 0.61–1.24)
GFR, Estimated: >60 mL/min (ref >60)
Glucose, Bld: 165 mg/dL — ABNORMAL HIGH (ref 70–99)
Potassium: 4.1 mmol/L (ref 3.5–5.1)
Sodium: 135 mmol/L (ref 135–145)
Total Bilirubin: 0.9 mg/dL (ref 0.3–1.2)
Total Protein: 7.2 g/dL (ref 6.5–8.1)

## 2023-05-28 MED ORDER — IOHEXOL 300 MG/ML  SOLN
100.0000 mL | Freq: Once | INTRAMUSCULAR | Status: AC | PRN
  Administered 2023-05-28: 100 mL via INTRAVENOUS

## 2023-05-28 MED ORDER — SODIUM CHLORIDE 0.9 % IV BOLUS
1000.0000 mL | Freq: Once | INTRAVENOUS | Status: AC
  Administered 2023-05-28: 1000 mL via INTRAVENOUS

## 2023-05-28 NOTE — ED Provider Notes (Signed)
EMERGENCY DEPARTMENT AT [facility] Provider Note   CSN: [id number] Arrival date & time: 05/28/23  1610     History {Add pertinent medical, surgical, social history, OB history to HPI:1} Chief Complaint  Patient presents with   Abdominal Pain    David Weeks is a 62 y.o. male.  Patient states that he has a history of gastric bypass and needed dilatation at the anastomosis in 2021.  He is having the similar symptoms now that he had back then again.  This been going on for about a couple weeks where he has difficulty getting food to pass his stomach.   Abdominal Pain      Home Medications Prior to Admission medications   Medication Sig Start Date End Date Taking? Authorizing Provider  acetaminophen (TYLENOL) 500 MG tablet Take 1,500 mg by mouth every 6 (six) hours as needed for moderate pain or headache.    [provider]  amphetamine-dextroamphetamine (ADDERALL) 10 MG tablet Take 10 mg by mouth daily as needed (focus). 07/21/17   [provider]  gabapentin (NEURONTIN) 300 MG capsule Take 300 mg by mouth 3 (three) times daily. 01/04/22   [provider]  JARDIANCE 10 MG TABS tablet Take 10 mg by mouth every evening. 08/28/17   [provider]  lisinopril (PRINIVIL,ZESTRIL) 10 MG tablet Take 10 mg by mouth daily. 07/21/17   [provider]  metFORMIN (GLUCOPHAGE) 1000 MG tablet Take 1,000 mg by mouth 2 (two) times daily with a meal. 08/28/17   [provider]  Multiple Vitamin (MULTI-VITAMINS) TABS Take 1 tablet by mouth at bedtime.    [provider]  nicotine (NICODERM CQ - DOSED IN MG/24 HOURS) 14 mg/24hr patch Place 1 patch (14 mg total) onto the skin daily. 04/24/22   Dorcas Carrow, MD  OZEMPIC, 1 MG/DOSE, 4 MG/3ML SOPN Inject 1 mg into the skin every Sunday. 03/09/22   [provider]  RIVAROXABAN Carlena Hurl) VTE STARTER PACK (15 & 20 MG) Follow package directions: Take one 15mg  tablet by  mouth twice a day. On day 22, switch to one 20mg  tablet once a day. Take with food. 04/23/22   Dorcas Carrow, MD  rosuvastatin (CRESTOR) 20 MG tablet Take 20 mg by mouth at bedtime. 07/21/17   [provider]  sildenafil (REVATIO) 20 MG tablet Take 60-80 mg by mouth daily as needed (prior to intercourse). 07/21/17   [provider]  traZODone (DESYREL) 50 MG tablet Take 50 mg by mouth at bedtime as needed for sleep. 01/04/22   [provider]      Allergies    Lovenox [enoxaparin]    Review of Systems   Review of Systems  Gastrointestinal:  Positive for abdominal pain.    Physical Exam Updated Vital Signs BP (!) 152/86 (BP Location: Left Arm)   Pulse 92   Temp 97.7 F (36.5 C) (Oral)   Resp 16   Ht 6' (1.829 m)   Wt 94.3 kg   SpO2 100%   BMI 28.21 kg/m  Physical Exam  ED Results / Procedures / Treatments   Labs (all labs ordered are listed, but only abnormal results are displayed) Labs Reviewed  COMPREHENSIVE METABOLIC PANEL - Abnormal; Notable for the following components:      Result Value   Glucose, Bld 165 (*)    All other components within normal limits  CBC WITH DIFFERENTIAL/PLATELET    EKG None  Radiology CT ABDOMEN PELVIS W CONTRAST  Result Date:  05/28/2023 CLINICAL DATA:  Abdominal pain. History of gastric bypass 8 years ago. EXAM: CT ABDOMEN AND PELVIS WITH CONTRAST TECHNIQUE: Multidetector CT imaging of the abdomen and pelvis was performed using the standard protocol following bolus administration of intravenous contrast. RADIATION DOSE REDUCTION: This exam was performed according to the departmental dose-optimization program which includes automated exposure control, adjustment of the mA and/or kV according to patient size and/or use of iterative reconstruction technique. CONTRAST:  OMNIPAQUE IOHEXOL 300 MG/ML  SOLN COMPARISON:  CT chest dated 04/22/2022. FINDINGS: Lower chest: No acute abnormality. Hepatobiliary: No focal liver  abnormality is seen. Layering gallstones are noted in the gallbladder. No gallbladder wall thickening or biliary dilatation. Pancreas: Unremarkable. No pancreatic ductal dilatation or surrounding inflammatory changes. Spleen: Normal in size without focal abnormality. Adrenals/Urinary Tract: Adrenal glands are unremarkable. Kidneys are normal, without renal calculi, focal lesion, or hydronephrosis. Bladder is unremarkable. Stomach/Bowel: The patient is status post gastric bypass surgery. Appendix appears normal. No evidence of bowel wall thickening, distention, or inflammatory changes. Vascular/Lymphatic: An infrarenal inferior vena cava filter is noted. Vascular calcifications are seen in the abdominal aorta. No enlarged abdominal or pelvic lymph nodes. Reproductive: Prostate is unremarkable. Other: No abdominal wall hernia or abnormality. No abdominopelvic ascites. Musculoskeletal: Degenerative changes are seen in the spine. IMPRESSION: 1. No acute findings in the abdomen or pelvis. 2. Cholelithiasis. Aortic Atherosclerosis (ICD10-I70.0). Electronically Signed   By: Romona Curls M.D.   On: 05/28/2023 19:59    Procedures Procedures  {Document cardiac monitor, telemetry assessment procedure when appropriate:1}  Medications Ordered in ED Medications  sodium chloride 0.9 % bolus 1,000 mL (0 mLs Intravenous Stopped 05/28/23 2031)  iohexol (OMNIPAQUE) 300 MG/ML solution 100 mL (100 mLs Intravenous Contrast Given 05/28/23 1912)    ED Course/ Medical Decision Making/ A&P   {   Click here for ABCD2, HEART and other calculatorsREFRESH Note before signing :1}                          Medical Decision Making Amount and/or Complexity of Data Reviewed Labs: ordered. Radiology: ordered.  Risk Prescription drug management.   Patient with most likely obstruction at his anastomosis from his gastric bypass.  He is hydrated and in no pain labs are unremarkable.  I spoke with [facility] gastroenterologist Dr.  Lorenso Quarry and she stated that they will try to get him in the office this week and look into arranging a dilatation  {Document critical care time when appropriate:1} {Document review of labs and clinical decision tools ie heart score, Chads2Vasc2 etc:1}  {Document your independent review of radiology images, and any outside records:1} {Document your discussion with family members, caretakers, and with consultants:1} {Document social determinants of health affecting pt's care:1} {Document your decision making why or why not admission, treatments were needed:1} Final Clinical Impression(s) / ED Diagnoses Final diagnoses:  Pain of upper abdomen    Rx / DC Orders ED Discharge Orders     None

## 2023-05-28 NOTE — ED Triage Notes (Deleted)
Pt states she has had a fever of 100.1 with chills, has taken meds and fever returns. Yesterday and today 103.6 taking meds without relief. Throws up the motrin.

## 2023-05-28 NOTE — ED Triage Notes (Signed)
Pt states he had gastric bypass 8 years ago, has had to have area stretched due to ulcer developing resulting in constriction of area, 2021 pt had the procedure and now symptoms have returned. Has to purge to feel better after a few bites. He is concerned he may be dehydrated, Records at [facility])

## 2023-05-28 NOTE — Discharge Instructions (Signed)
Take liquids only.  If you do not hear from [facility] gastroenterology on Monday then you should call them Tuesday so you can get an appointment this week.  Return to the emergency department if have any problems

## 2023-06-07 ENCOUNTER — Other Ambulatory Visit: Payer: Self-pay | Admitting: Internal Medicine

## 2023-06-07 DIAGNOSIS — K9189 Other postprocedural complications and disorders of digestive system: Secondary | ICD-10-CM

## 2023-06-14 ENCOUNTER — Other Ambulatory Visit: Payer: BC Managed Care – PPO

## 2023-06-25 ENCOUNTER — Ambulatory Visit
Admission: RE | Admit: 2023-06-25 | Discharge: 2023-06-25 | Disposition: A | Discharge status: Home / Self Care | Payer: BC Managed Care – PPO | Source: Ambulatory Visit | Attending: Internal Medicine | Admitting: Internal Medicine

## 2023-06-25 DIAGNOSIS — K9189 Other postprocedural complications and disorders of digestive system: Secondary | ICD-10-CM

## 2023-11-26 ENCOUNTER — Telehealth: Payer: Self-pay | Admitting: *Deleted

## 2023-11-26 NOTE — Telephone Encounter (Signed)
Pt called for phone number for Dr Estell Harpin.  RNCM provided number to EDP secretary [phone].

## 2024-02-27 IMAGING — CT CT ANGIO CHEST
2 of 7 series · 18 of 46 positions shown · IV contrast (APPLIED)
Comparison: July 29, 2009

CLINICAL DATA: Dizziness and lightheaded.

EXAM:
CT ANGIOGRAPHY CHEST WITH CONTRAST
TECHNIQUE: Multidetector CT imaging of the chest was performed using the
standard protocol during bolus administration of intravenous
contrast. Multiplanar CT image reconstructions and MIPs were
obtained to evaluate the vascular anatomy.

[Series 7: thins · axial · 0.81mm/px · z∈[+1066,+1368]mm · 15 of 487 slices shown]
[im 28/487  lung]
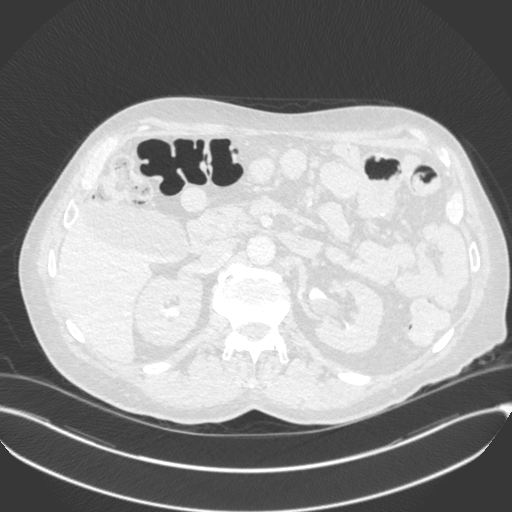
[im 55/487  soft-tissue]
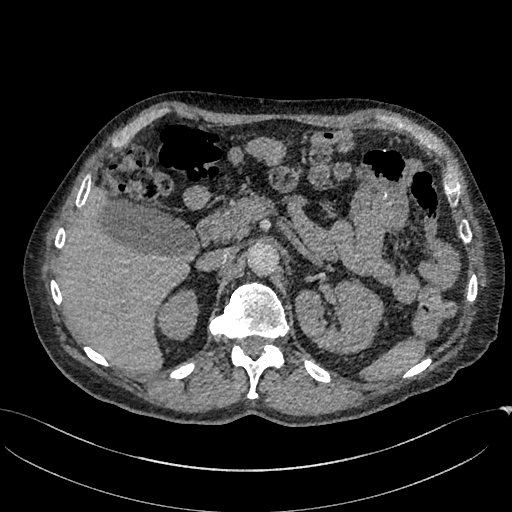
[im 82/487  lung]
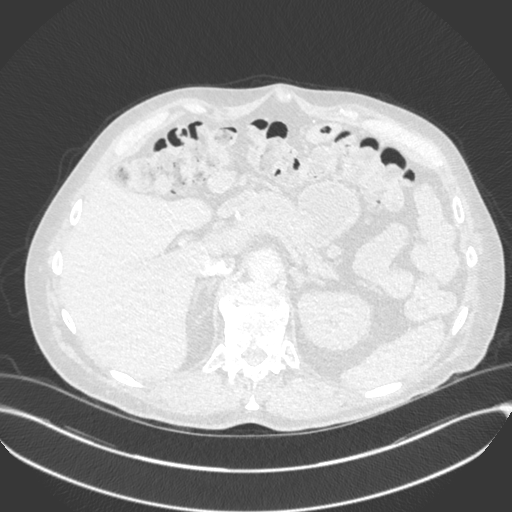
[im 109/487  soft-tissue]
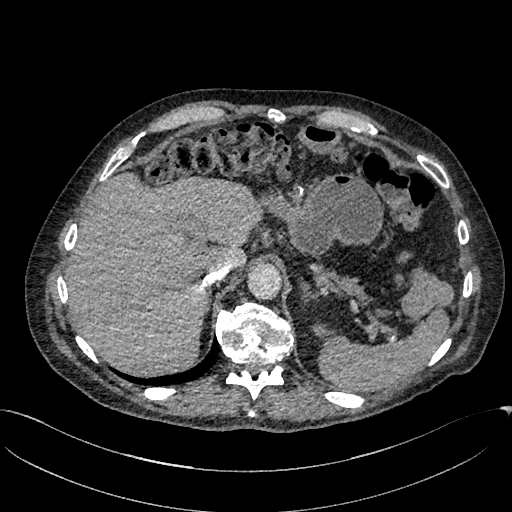
[im 163/487  lung]
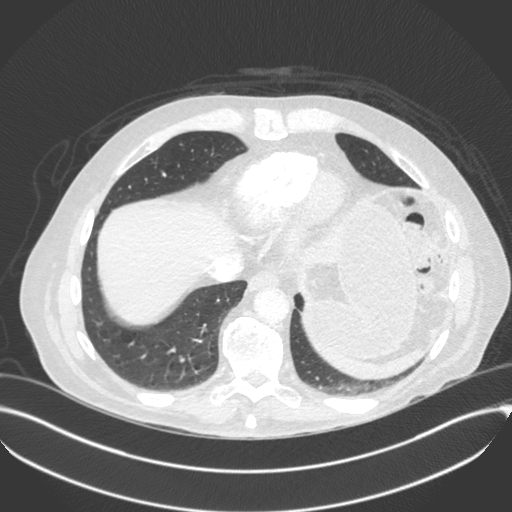
[im 190/487  soft-tissue]
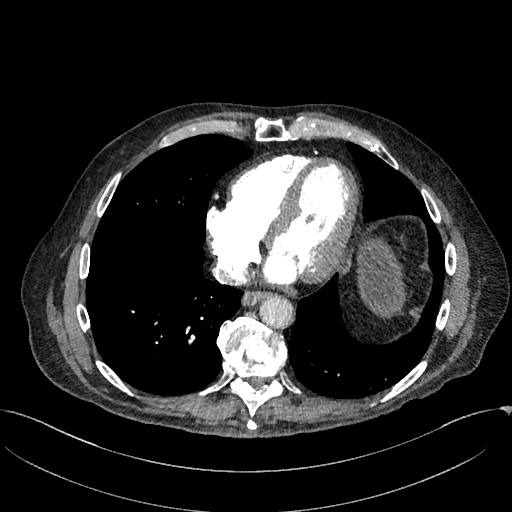
[im 217/487  lung]
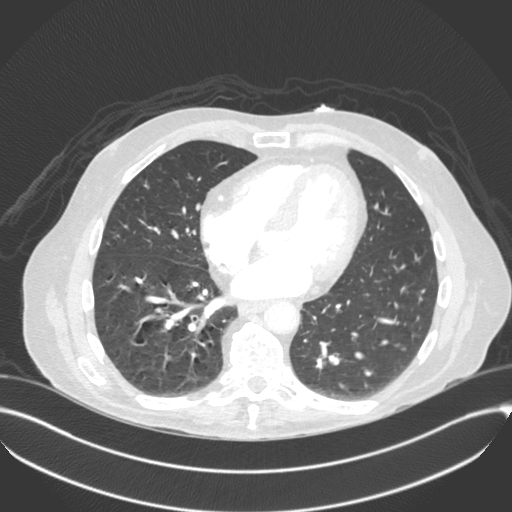
[im 244/487  soft-tissue]
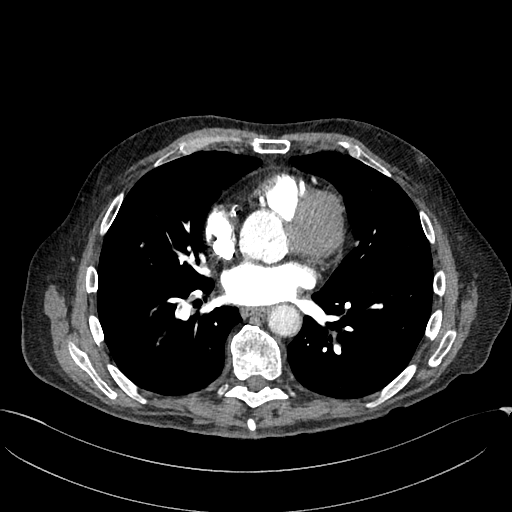
[im 271/487  lung]
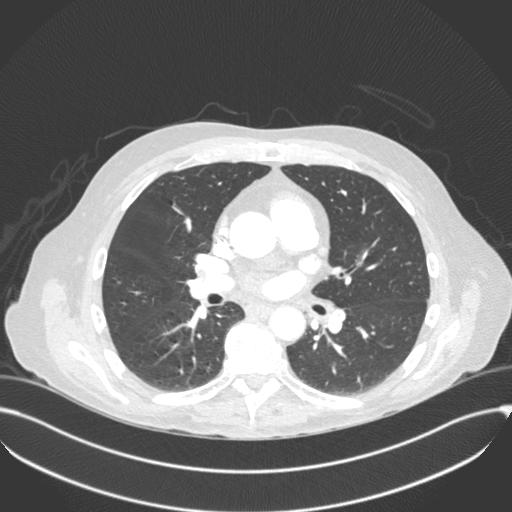
[im 298/487  soft-tissue]
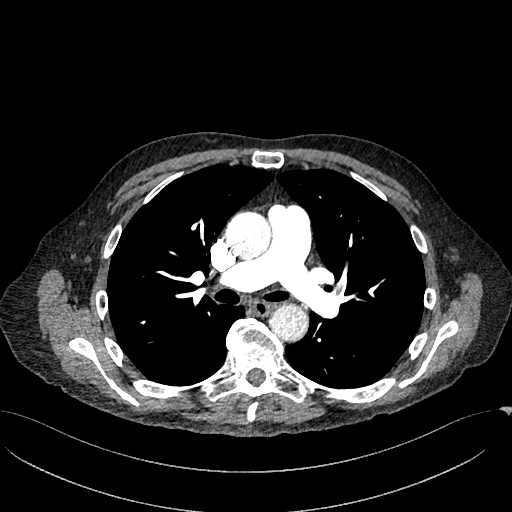
[im 325/487  lung]
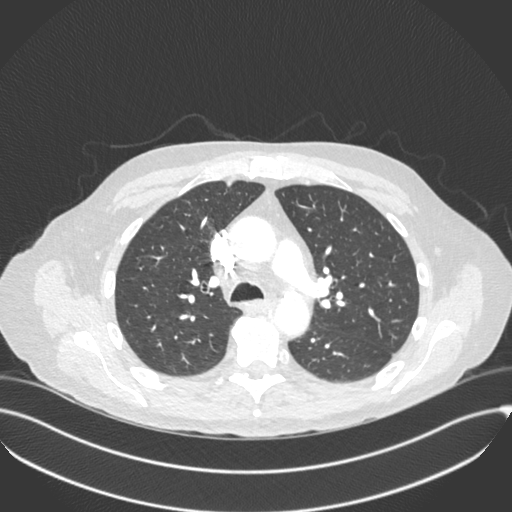
[im 379/487  soft-tissue]
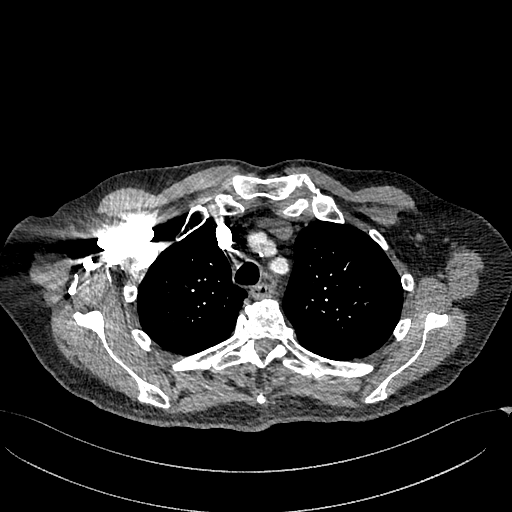
[im 406/487  lung]
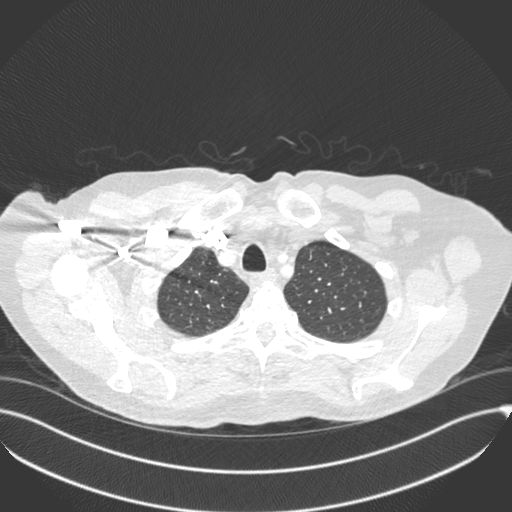
[im 433/487  soft-tissue]
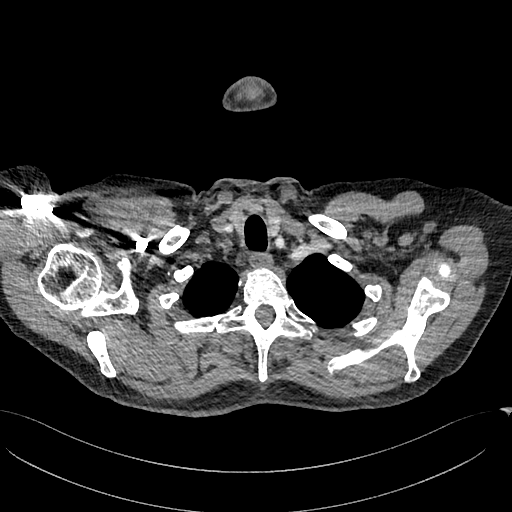
[im 460/487  lung]
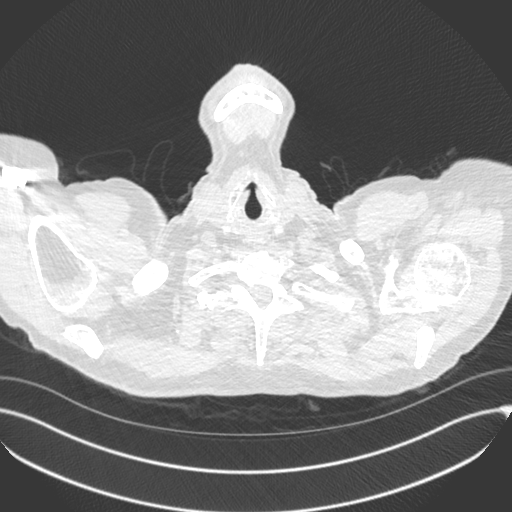

[Series 8: cor · coronal · 0.67mm/px · 3 of 156 slices shown]
[im 39/156  soft-tissue]
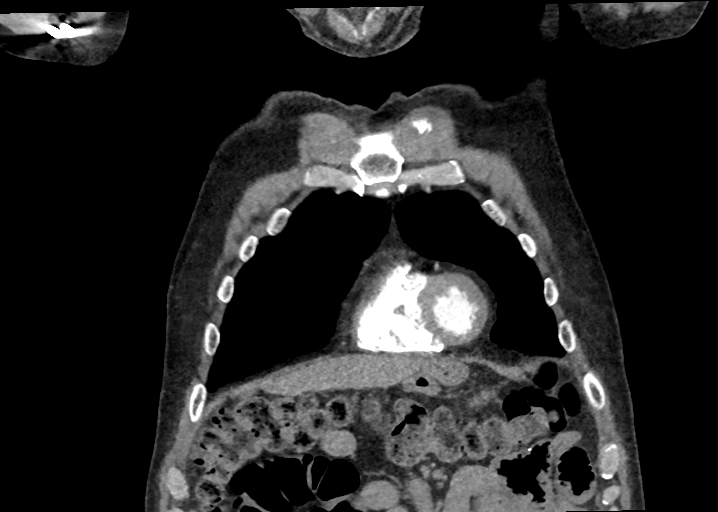
[im 78/156  soft-tissue]
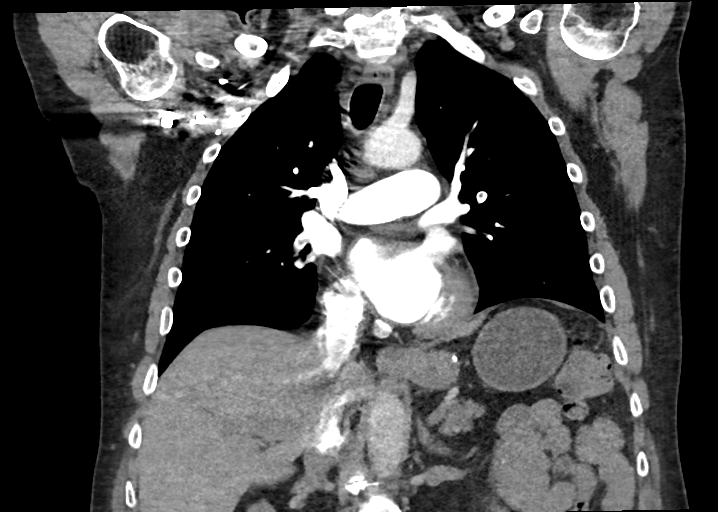
[im 117/156  soft-tissue]
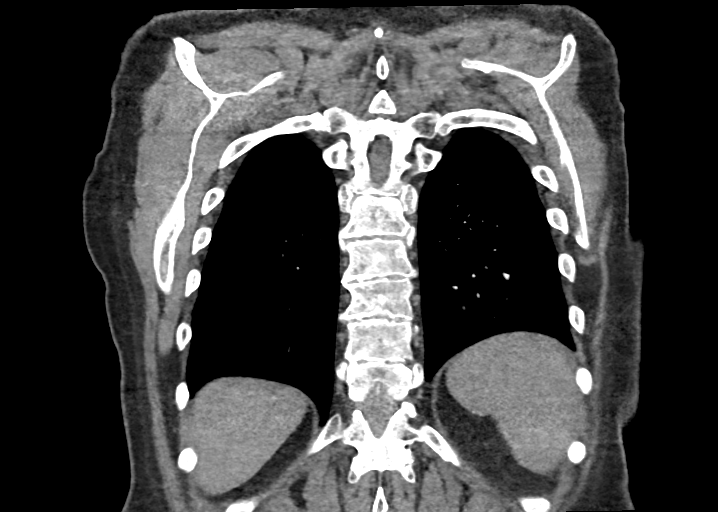

[18 of 46 positions shown; findings below may reference images not displayed]

RADIATION DOSE REDUCTION: This exam was performed according to the
departmental dose-optimization program which includes automated
exposure control, adjustment of the mA and/or kV according to
patient size and/or use of iterative reconstruction technique.

CONTRAST:  80mL OMNIPAQUE IOHEXOL 350 MG/ML SOLN
FINDINGS: Cardiovascular: Marked severity areas of intraluminal low
attenuation are seen involving multiple upper lobe, middle lobe and
lower lobe branches of the bilateral pulmonary arteries. Normal
heart size with mild to moderate severity coronary artery
calcification. There is mild right heart strain with an RV/LV ratio
of 1.04. No pericardial effusion.

Mediastinum/Nodes: No enlarged mediastinal, hilar, or axillary lymph
nodes. Thyroid gland, trachea, and esophagus demonstrate no
significant findings.

Lungs/Pleura: Lungs are clear. No pleural effusion or pneumothorax.

Upper Abdomen: Multiple surgical sutures are seen along the gastric
region.

A thin layer of subcentimeter gallstones is seen within the
dependent portion of an otherwise normal-appearing gallbladder.

An inferior vena cava filter is in place.

Musculoskeletal: Multilevel degenerative changes are seen throughout
the thoracic spine.

Review of the MIP images confirms the above findings.
IMPRESSION: 1. Marked severity bilateral pulmonary embolism with mild right
heart strain with an RV/LV ratio of 1.04.
2. Cholelithiasis.

## 2024-02-27 IMAGING — CT CT HEAD W/O CM
4 series · 17 of 47 positions shown, 19 images · non-contrast
Comparison: February 19, 2015.

CLINICAL DATA: Dizziness.



[Series 3: head without · axial · non-contrast · 0.44mm/px · z∈[-152,-32]mm · 7 of 34 slices shown, 9 images]
[im 5/34  brain]
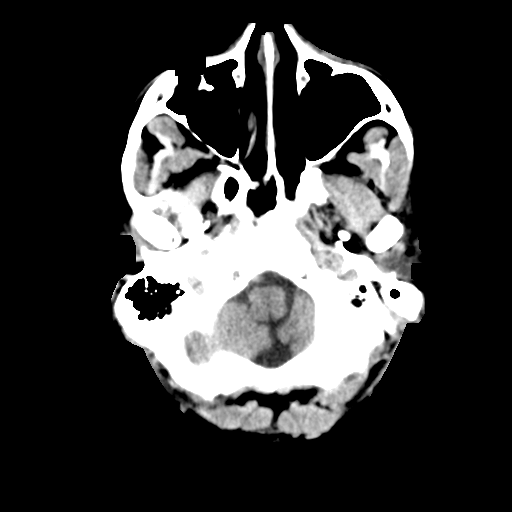
[im 5/34  bone]
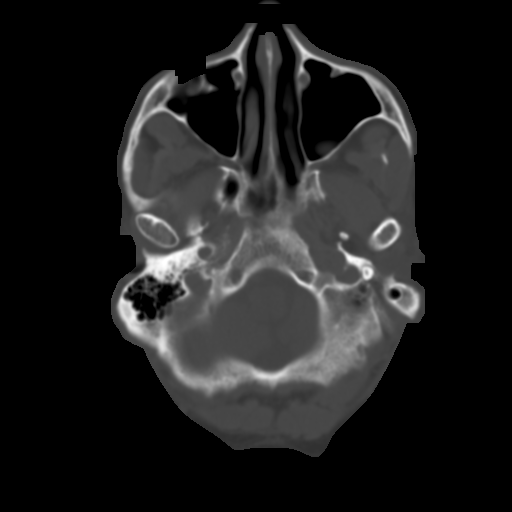
[im 9/34  brain]
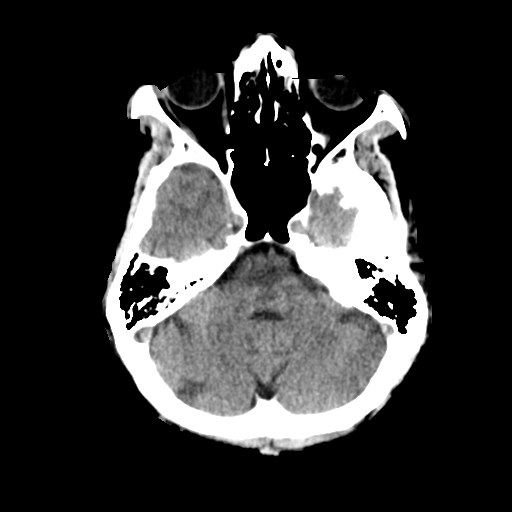
[im 13/34  brain]
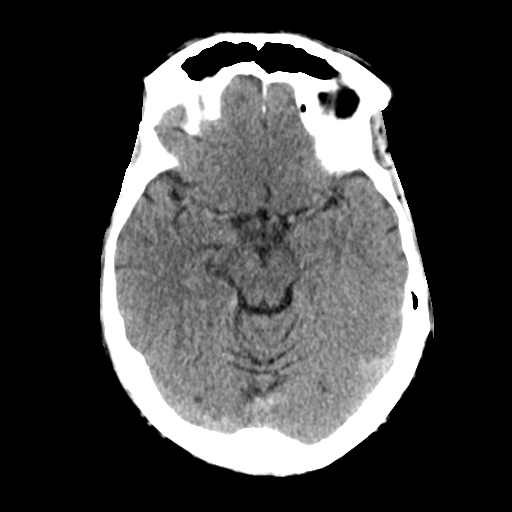
[im 17/34  brain]
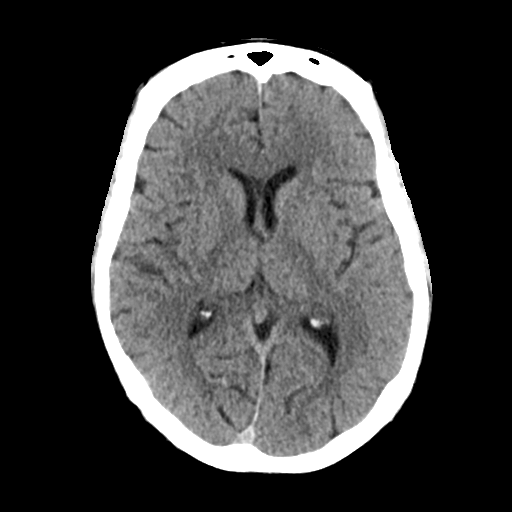
[im 21/34  brain]
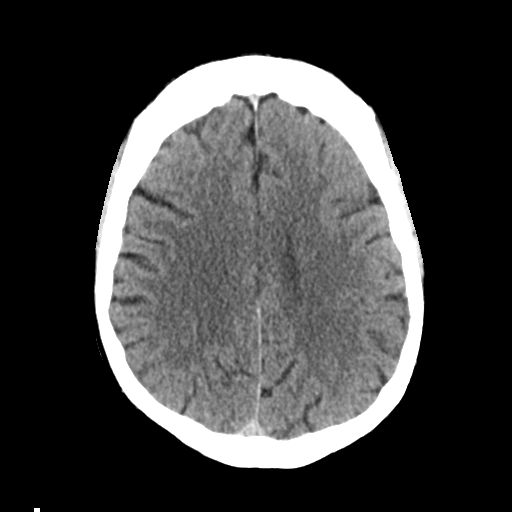
[im 21/34  bone]
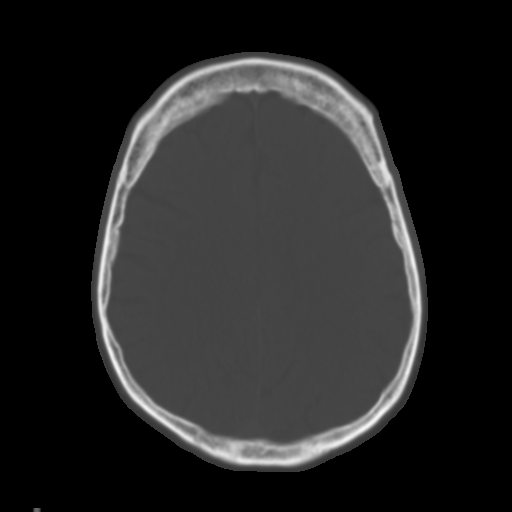
[im 25/34  brain]
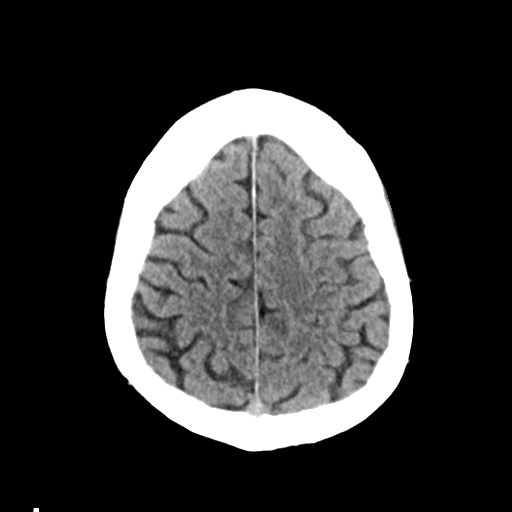
[im 29/34  brain]
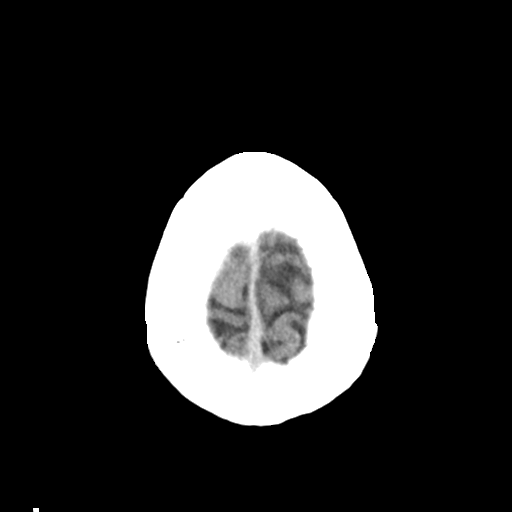

[Series 4: head bone · axial · 0.44mm/px · z∈[-156,-98]mm · 4 of 85 slices shown]
[im 9/85  bone]
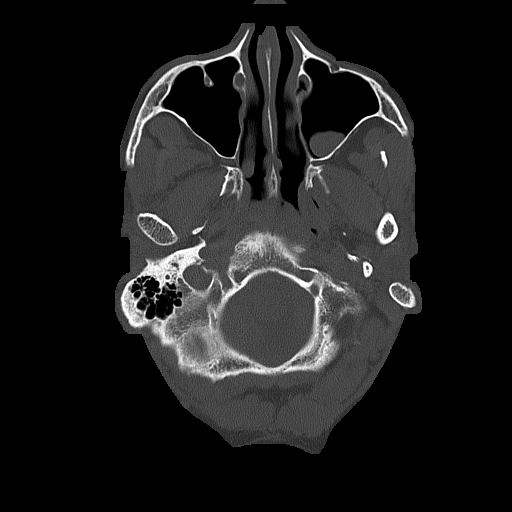
[im 17/85  bone]
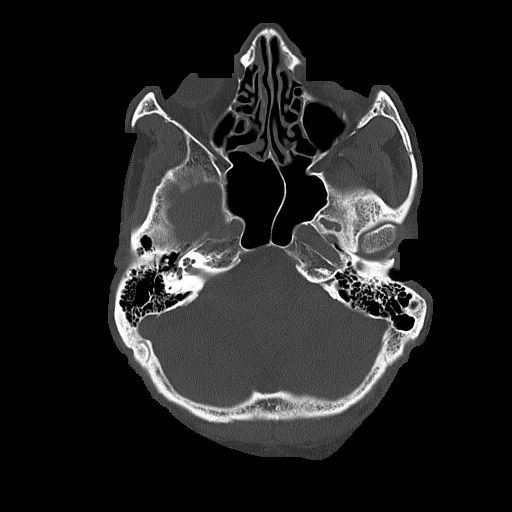
[im 26/85  bone]
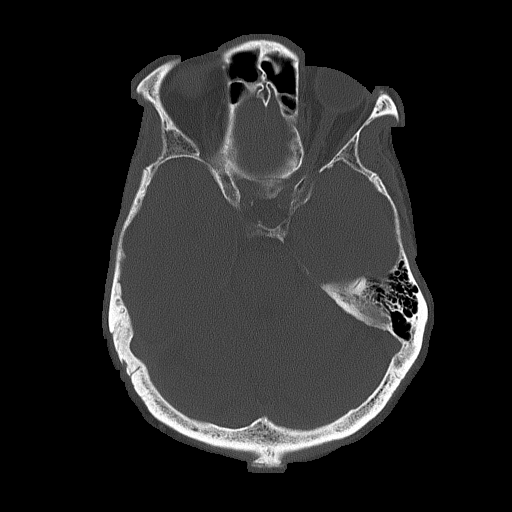
[im 38/85  bone]
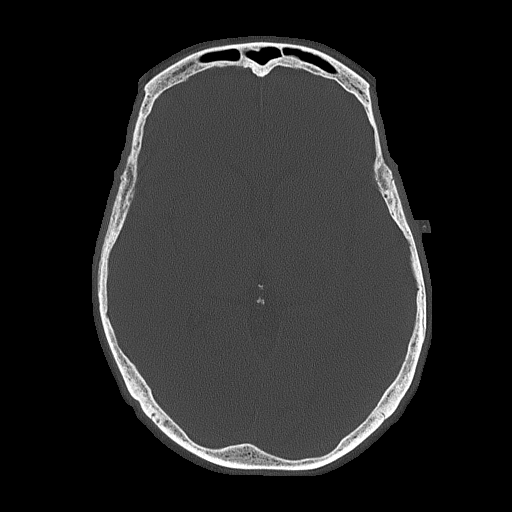

[Series 5: head without cor · coronal · non-contrast · 0.33mm/px · 3 of 76 slices shown]
[im 26/76  brain]
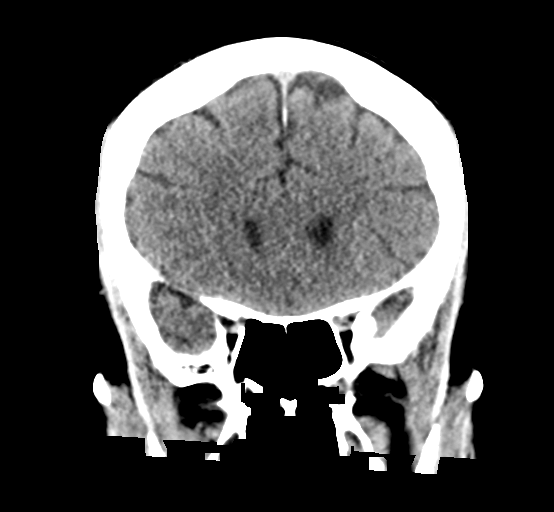
[im 34/76  brain]
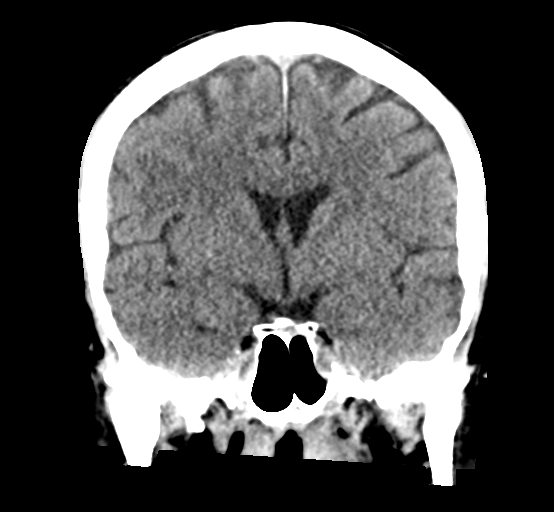
[im 42/76  brain]
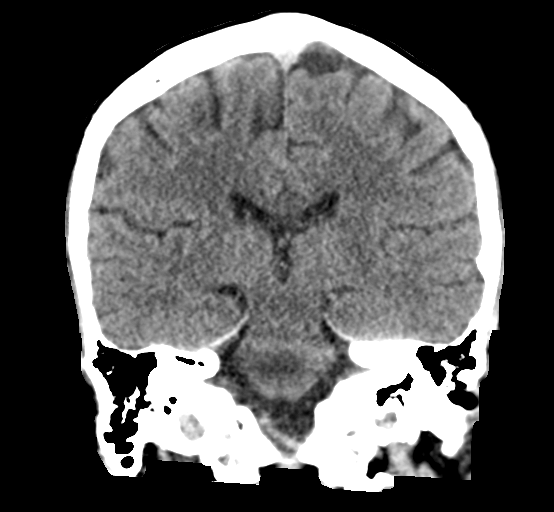

[Series 6: head without sag · sagittal · non-contrast · 0.35mm/px · 3 of 60 slices shown]
[im 20/60  brain]
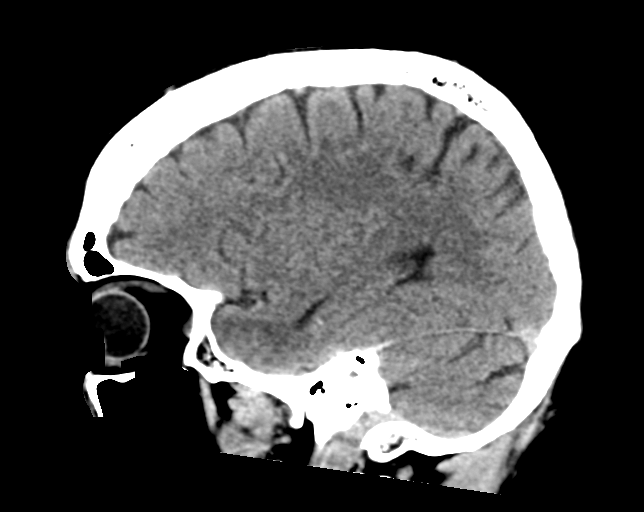
[im 30/60  brain]
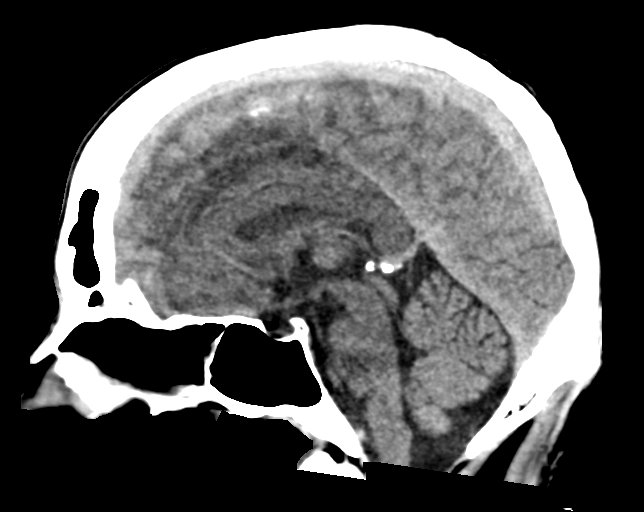
[im 40/60  brain]
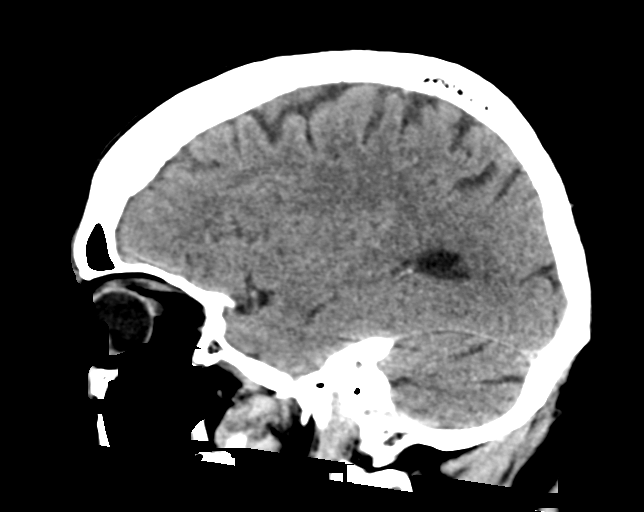

[17 of 47 positions shown; findings below may reference images not displayed]

FINDINGS: Brain: No evidence of acute infarction, hemorrhage, hydrocephalus,
extra-axial collection or mass lesion/mass effect.

Vascular: No hyperdense vessel or unexpected calcification.

Skull: Normal. Negative for fracture or focal lesion.

Sinuses/Orbits: No acute finding.

Other: None.
IMPRESSION: No acute intracranial abnormality seen.

## 2024-04-17 ENCOUNTER — Telehealth: Payer: Self-pay

## 2024-04-17 NOTE — Telephone Encounter (Signed)
-----   Message from [facility] sent at 04/14/2024  1:18 PM EDT ----- Regarding: New referral Pod [facility] nurses, Dr. Kimble Pennant from [facility] has sent a referral to us . Lets be on the look out or have our referral team be on the look out for this patient's referral. Patient has history of recurring gastrojejunostomy anastomotic stricture. Being referred for consideration of advanced therapy including Axios stenting. Once we have referral and there are no issues from an insurance standpoint, we can work on scheduling an overbook or held slot or 3:50 PM slot for this patient. Thanks. GM

## 2024-04-20 NOTE — Telephone Encounter (Signed)
 Can one of you get this pt set up for Dr Brice Campi?  I think we need to see the referral per GM to ensure insurance is accepted.

## 2024-04-20 NOTE — Telephone Encounter (Signed)
 Can schedule as outlined for clinic. GM

## 2024-04-20 NOTE — Telephone Encounter (Signed)
 Left vm for patient to call and be scheduled.

## 2024-04-24 NOTE — Telephone Encounter (Signed)
 Thank you :)

## 2024-05-24 NOTE — Telephone Encounter (Signed)
 I have also attempted to reach pt with no response.

## 2024-05-24 NOTE — Telephone Encounter (Signed)
 FYI WO team has tried to reach him and unable. Let us  know if you want us  to continue to reach out, otherwise we will remove from our potential patient list. Thanks. GM

## 2024-07-03 NOTE — Telephone Encounter (Signed)
 Patient calls us  from an [place] office. He said he has not received calls from us  about scheduling. He is interested in scheduling the consultation appointment. Scheduled appointment 08/16/24 at 10:30 am.

## 2024-08-16 ENCOUNTER — Encounter: Payer: Self-pay | Admitting: Gastroenterology

## 2024-08-16 ENCOUNTER — Ambulatory Visit: Payer: Self-pay | Admitting: Gastroenterology

## 2024-08-16 VITALS — BP 128/70 | HR 81 | Ht 72.0 in | Wt 227.2 lb

## 2024-08-16 DIAGNOSIS — K929 Disease of digestive system, unspecified: Secondary | ICD-10-CM | POA: Diagnosis not present

## 2024-08-16 DIAGNOSIS — K3189 Other diseases of stomach and duodenum: Secondary | ICD-10-CM

## 2024-08-16 DIAGNOSIS — R1114 Bilious vomiting: Secondary | ICD-10-CM

## 2024-08-16 DIAGNOSIS — Z9884 Bariatric surgery status: Secondary | ICD-10-CM | POA: Diagnosis not present

## 2024-08-16 DIAGNOSIS — R933 Abnormal findings on diagnostic imaging of other parts of digestive tract: Secondary | ICD-10-CM

## 2024-08-16 NOTE — Patient Instructions (Signed)
 If interested in possible procedures then send a my chart message or call. Dr Wilhelmenia will consider Upper GI vs EGD at that time.   Follow-up as needed.   _______________________________________________________  If your blood pressure at your visit was 140/90 or greater, please contact your primary care physician to follow up on this.  _______________________________________________________  If you are age 65 or older, your body mass index should be between 23-30. Your Body mass index is 30.81 kg/m. If this is out of the aforementioned range listed, please consider follow up with your Primary Care Provider.  If you are age 51 or younger, your body mass index should be between 19-25. Your Body mass index is 30.81 kg/m. If this is out of the aformentioned range listed, please consider follow up with your Primary Care Provider.   ________________________________________________________  The [facility] providers would like to encourage you to use MYCHART to communicate with providers for non-urgent requests or questions.  Due to long hold times on the telephone, sending your provider a message by [facility] may be a faster and more efficient way to get a response.  Please allow 48 business hours for a response.  Please remember that this is for non-urgent requests.  _______________________________________________________  [facility] is using a team-based approach to care.  Your team is made up of your doctor and two to three APPS. Our APPS (Nurse Practitioners and Physician Assistants) work with your physician to ensure care continuity for you. They are fully qualified to address your health concerns and develop a treatment plan. They communicate directly with your gastroenterologist to care for you. Seeing the Advanced Practice Practitioners on your physician's team can help you by facilitating care more promptly, often allowing for earlier appointments, access to diagnostic testing,  procedures, and other specialty referrals.   Thank you for choosing me and [facility].  Dr. Wilhelmenia

## 2024-08-19 ENCOUNTER — Encounter: Payer: Self-pay | Admitting: Gastroenterology

## 2024-08-19 DIAGNOSIS — Z9884 Bariatric surgery status: Secondary | ICD-10-CM | POA: Insufficient documentation

## 2024-08-19 DIAGNOSIS — K3189 Other diseases of stomach and duodenum: Secondary | ICD-10-CM | POA: Insufficient documentation

## 2024-08-19 DIAGNOSIS — R1114 Bilious vomiting: Secondary | ICD-10-CM | POA: Insufficient documentation

## 2024-08-19 DIAGNOSIS — R933 Abnormal findings on diagnostic imaging of other parts of digestive tract: Secondary | ICD-10-CM | POA: Insufficient documentation
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
